# Patient Record
Sex: Female | Born: 1975 | Race: White | Hispanic: No | Marital: Married | Smoking: Current every day smoker
Health system: Southern US, Community
[De-identification: ages and names within clinical notes are randomized; demographics above are authoritative.]

## PROBLEM LIST (undated history)

## (undated) DIAGNOSIS — I4891 Unspecified atrial fibrillation: Secondary | ICD-10-CM

## (undated) DIAGNOSIS — D497 Neoplasm of unspecified behavior of endocrine glands and other parts of nervous system: Secondary | ICD-10-CM

## (undated) HISTORY — PX: KNEE SURGERY: SHX244

## (undated) HISTORY — PX: OTHER SURGICAL HISTORY: SHX169

## (undated) HISTORY — PX: CHOLECYSTECTOMY: SHX55

## (undated) HISTORY — DX: Neoplasm of unspecified behavior of endocrine glands and other parts of nervous system: D49.7

---

## 1998-05-16 ENCOUNTER — Emergency Department (HOSPITAL_COMMUNITY): Admission: EM | Admit: 1998-05-16 | Discharge: 1998-05-16 | Payer: Self-pay | Admitting: Emergency Medicine

## 1999-05-24 ENCOUNTER — Ambulatory Visit (HOSPITAL_COMMUNITY): Admission: RE | Admit: 1999-05-24 | Discharge: 1999-05-24 | Payer: Self-pay | Admitting: Neurosurgery

## 1999-05-24 ENCOUNTER — Encounter: Payer: Self-pay | Admitting: Neurosurgery

## 2000-11-22 ENCOUNTER — Ambulatory Visit (HOSPITAL_COMMUNITY): Admission: RE | Admit: 2000-11-22 | Discharge: 2000-11-22 | Payer: Self-pay | Admitting: Neurosurgery

## 2000-11-22 ENCOUNTER — Encounter: Payer: Self-pay | Admitting: Neurosurgery

## 2005-03-27 ENCOUNTER — Ambulatory Visit: Payer: Self-pay | Admitting: Internal Medicine

## 2005-04-01 ENCOUNTER — Ambulatory Visit: Payer: Self-pay | Admitting: Internal Medicine

## 2005-04-08 ENCOUNTER — Ambulatory Visit: Payer: Self-pay | Admitting: Internal Medicine

## 2005-04-08 ENCOUNTER — Ambulatory Visit (HOSPITAL_COMMUNITY): Admission: RE | Admit: 2005-04-08 | Discharge: 2005-04-08 | Payer: Self-pay | Admitting: Internal Medicine

## 2005-04-10 ENCOUNTER — Ambulatory Visit (HOSPITAL_COMMUNITY): Admission: RE | Admit: 2005-04-10 | Discharge: 2005-04-10 | Payer: Self-pay | Admitting: Internal Medicine

## 2005-04-10 ENCOUNTER — Ambulatory Visit: Payer: Self-pay | Admitting: Internal Medicine

## 2005-05-01 ENCOUNTER — Ambulatory Visit: Payer: Self-pay | Admitting: Internal Medicine

## 2005-05-07 ENCOUNTER — Ambulatory Visit (HOSPITAL_COMMUNITY): Admission: RE | Admit: 2005-05-07 | Discharge: 2005-05-07 | Payer: Self-pay | Admitting: Internal Medicine

## 2005-05-13 ENCOUNTER — Ambulatory Visit: Payer: Self-pay | Admitting: Internal Medicine

## 2006-11-03 IMAGING — CT CT ABDOMEN W/ CM
1 of 3 series · 14 of 32 positions shown, 19 images · IV contrast (CONTRAST)
Comparison: none

CLINICAL DATA: Right upper abdominal pain.

[Series 4229: — · axial · 0.81mm/px · z∈[+1438,+1948]mm · 14 of 116 slices shown, 19 images]
[im 7/116  soft-tissue]
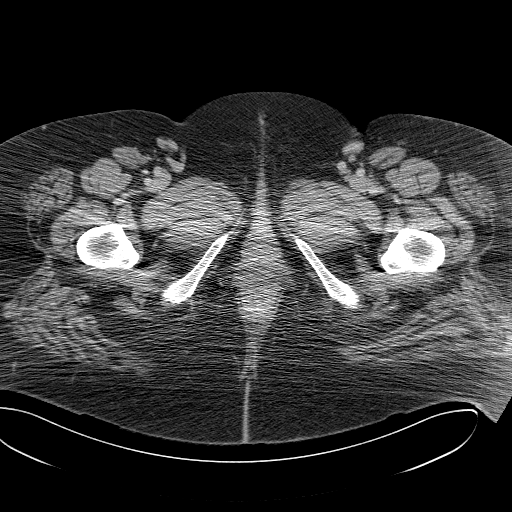
[im 7/116  bone]
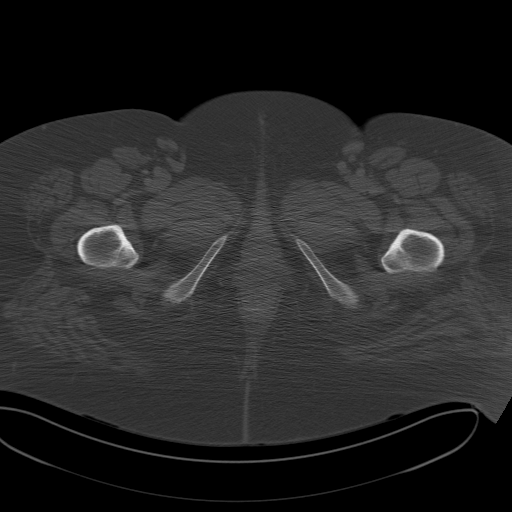
[im 13/116  soft-tissue]
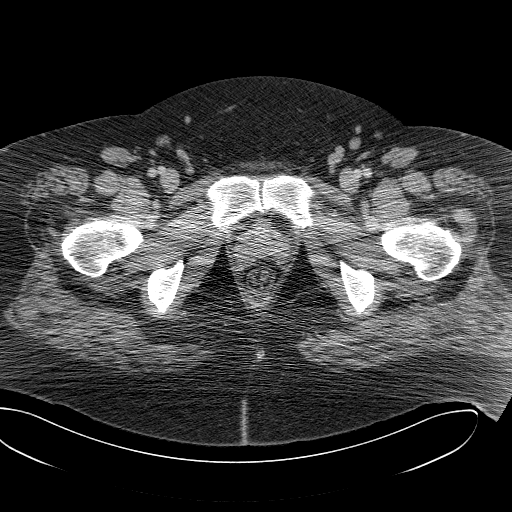
[im 26/116  soft-tissue]
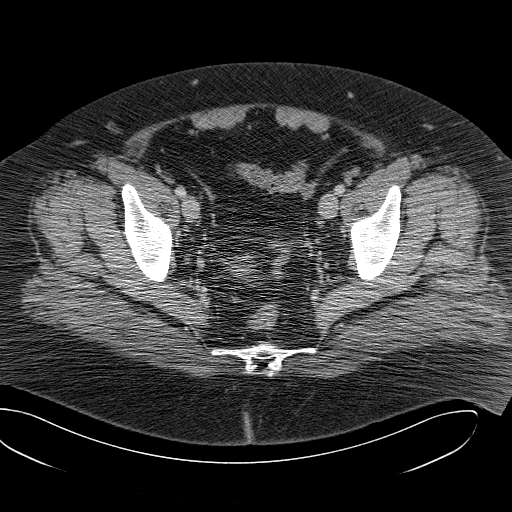
[im 32/116  soft-tissue]
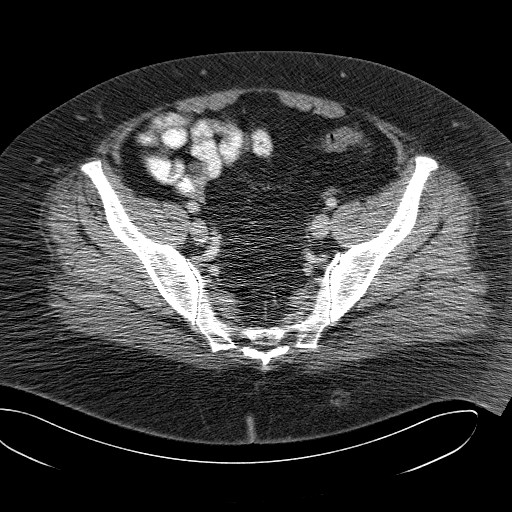
[im 39/116  soft-tissue]
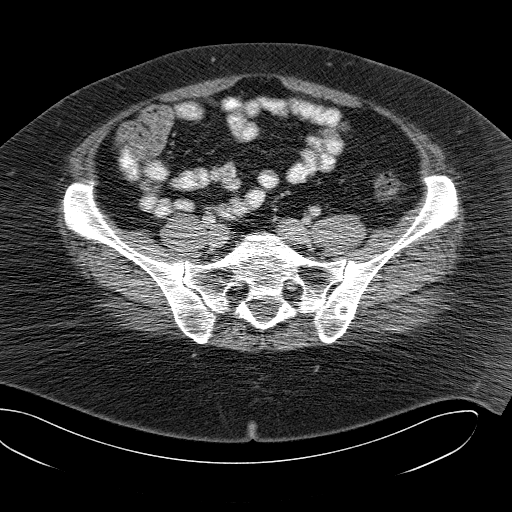
[im 52/116  soft-tissue]
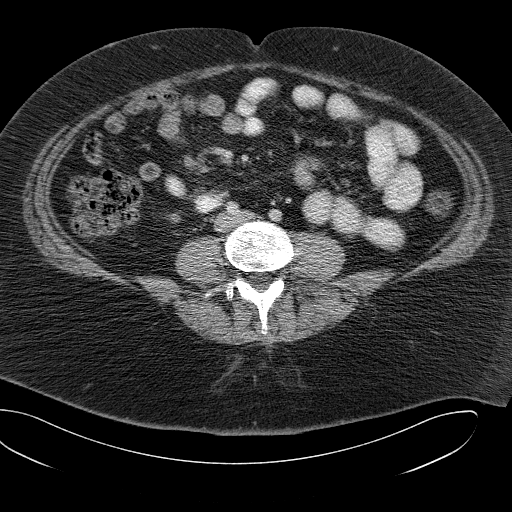
[im 58/116  soft-tissue]
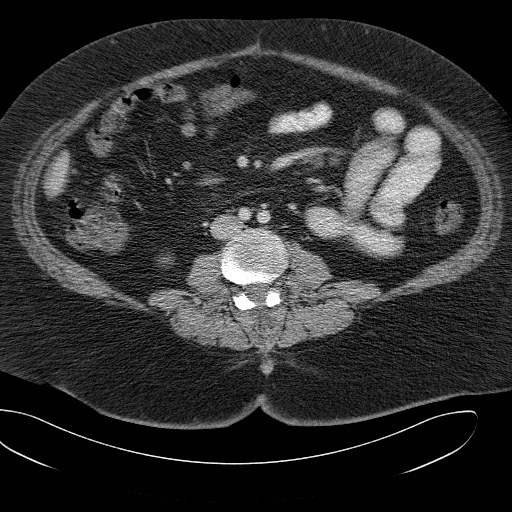
[im 64/116  soft-tissue]
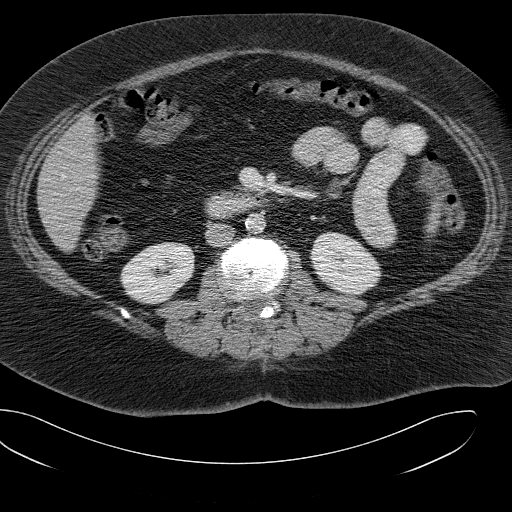
[im 77/116  soft-tissue]
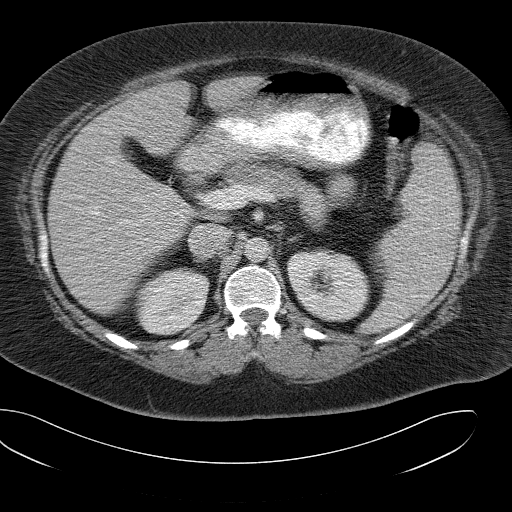
[im 77/116  bone]
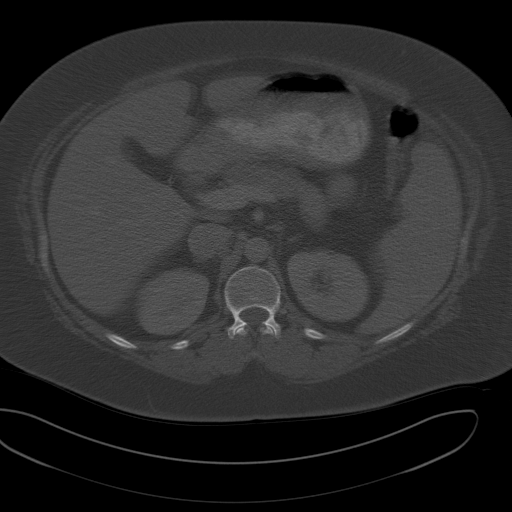
[im 84/116  soft-tissue]
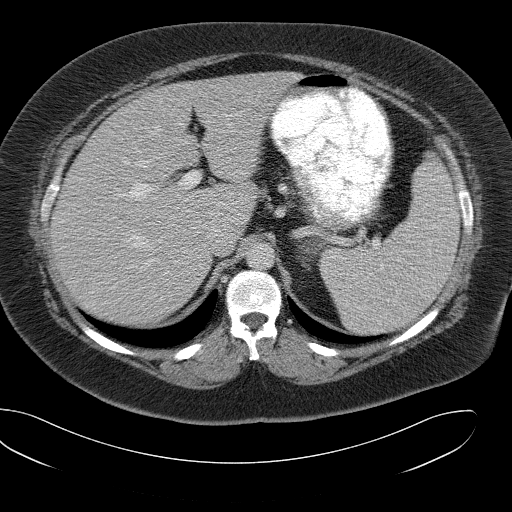
[im 90/116  soft-tissue]
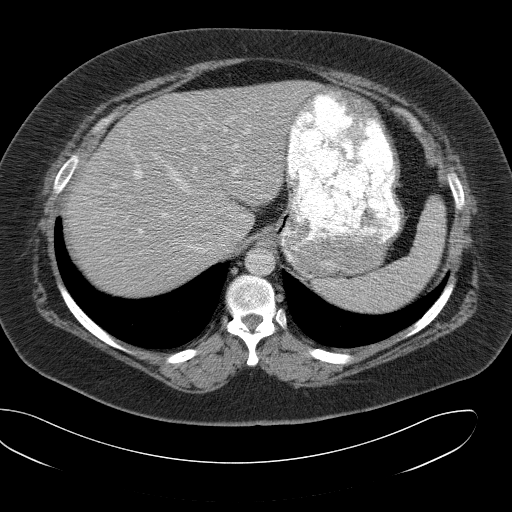
[im 90/116  lung]
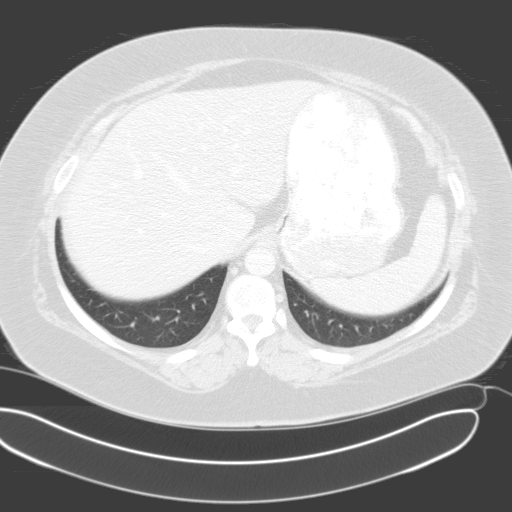
[im 96/116  lung]
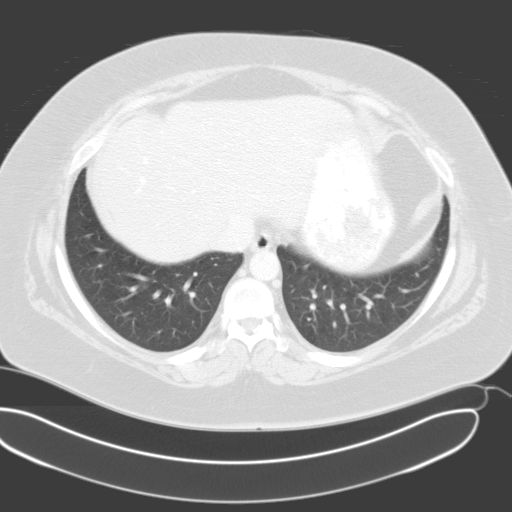
[im 103/116  soft-tissue]
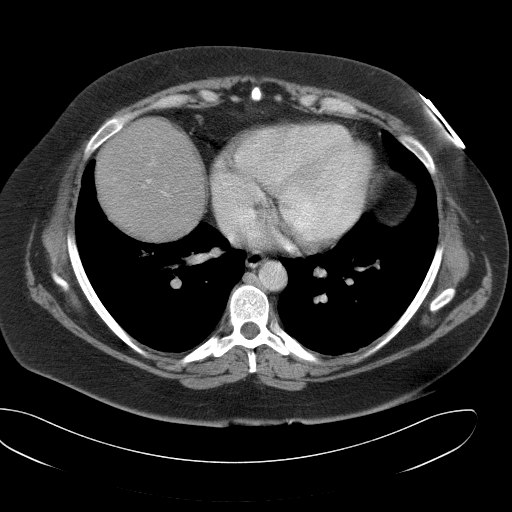
[im 103/116  lung]
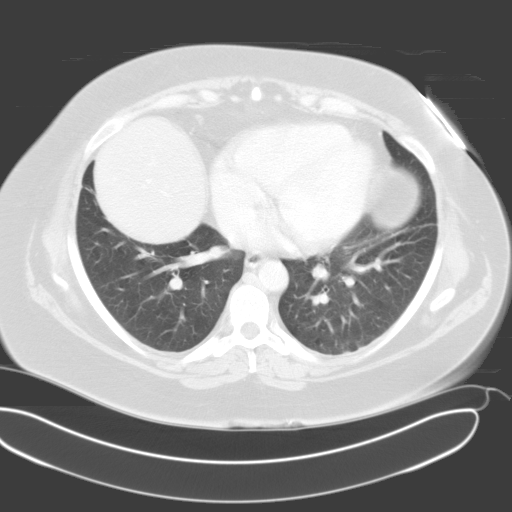
[im 109/116  soft-tissue]
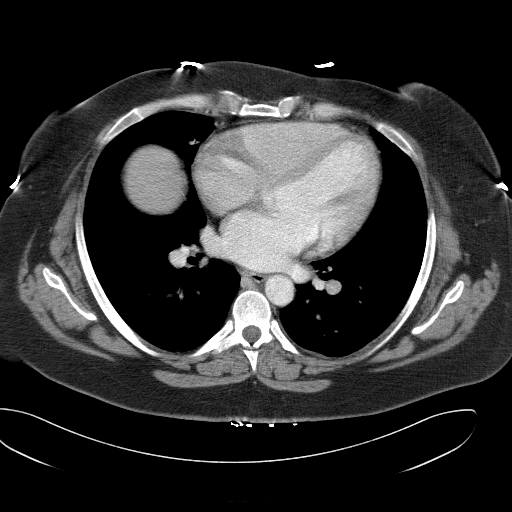
[im 109/116  lung]
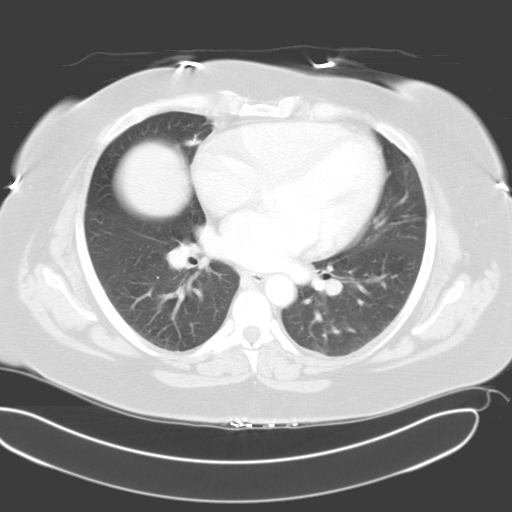

[14 of 32 positions shown; findings below may reference images not displayed]

CT abdomen with contrast:

Multidetector helical CT after 150 ml Zmnipaque-144 IV.  
No previous for comparison. Visualized lung bases clear. Vascular clips in the
gallbladder fossa. Unremarkable liver, gallbladder, adrenal glands, kidneys,
pancreas. Mild atheromatous change in the infrarenal abdominal aorta without
aneurysm. No free air. No ascites. Small bowel decompressed. Postoperative   and
degenerative changes in the lower lumbar spine. Scattered enlarged mesenteric
lymph nodes are noted in the right abdomen, left midabdomen, and at the root of
the mesentery, although no node measures greater than 1 cm in short axis
diameter.
IMPRESSION: 1. Borderline mesenteric adenopathy, nonspecific.
2. Postoperative changes as above.

CT pelvis with contrast:

Normal appendix. Colon is nondilated, unremarkable. Urinary bladder incompletely
distended. Uterus and adnexal regions unremarkable. No free fluid. No
adenopathy.
IMPRESSION: 1. Normal appendix.

## 2020-07-31 ENCOUNTER — Ambulatory Visit (INDEPENDENT_AMBULATORY_CARE_PROVIDER_SITE_OTHER): Payer: BC Managed Care – PPO

## 2020-07-31 ENCOUNTER — Other Ambulatory Visit: Payer: Self-pay

## 2020-07-31 ENCOUNTER — Ambulatory Visit (INDEPENDENT_AMBULATORY_CARE_PROVIDER_SITE_OTHER): Payer: BC Managed Care – PPO | Admitting: Podiatry

## 2020-07-31 DIAGNOSIS — M2041 Other hammer toe(s) (acquired), right foot: Secondary | ICD-10-CM

## 2020-07-31 DIAGNOSIS — R52 Pain, unspecified: Secondary | ICD-10-CM

## 2020-07-31 DIAGNOSIS — M779 Enthesopathy, unspecified: Secondary | ICD-10-CM | POA: Diagnosis not present

## 2020-08-02 NOTE — Progress Notes (Signed)
Subjective:   Patient ID: Brandi Meyers, female   DOB: 44 y.o.   MRN: [id number]   HPI Patient presents stating she has been having a lot of pain in her ankle and then she is having a lot of problems with the fourth toe on her right foot where there is a lesion that is been very painful and making it hard for her to wear shoe gear.  States she had previous surgery on this toe a number of years ago and this has become very very tender for her.  Patient states she cannot wear shoe gear with any degree of comfort and does not currently smoke and likes to be active   Review of Systems  All other systems reviewed and are negative.       Objective:  Physical Exam Vitals and nursing note reviewed.  Constitutional:      Appearance: She is well-developed.  Pulmonary:     Effort: Pulmonary effort is normal.  Musculoskeletal:        General: Normal range of motion.  Skin:    General: Skin is warm.  Neurological:     Mental Status: She is alert.     Neurovascular status intact muscle strength found to be adequate range of motion within normal limits.  Patient is noted to have a pressure sore on the lateral side of the right fourth digit at the proximal phalanx with a previous what appears to be middle phalangectomy that was done.  It is sore and raw and painful for her and I also noted that there is a lot of discomfort in the sinus tarsi right.  There is no proximal edema erythema drainage noted around the digit it appears to be just irritated painful tissue     Assessment:  Digital deformity fourth right with aggravated tissue secondary to pressure between the fifth and fourth toe and sinus tarsitis with inflammatory capsulitis right     Plan:  H&P reviewed all conditions and patient states her sugar has been under good control.  Today I reviewed her x-rays I then discussed that this area is probably not can heal on its own as she has tried numerous pads cushions without relief and I do think  removal of bone is what can be necessary for this..  I did go ahead today discussed that with her and went over other conservative options and she has opted for correction.  I allowed her to read consent form going over all possible complications and I did explain this is revisional surgery and as a result this is always going to be more difficult and recovery can be different.  Patient wants surgery signed consent form and is scheduled for outpatient surgery as soon as she is able to do it.  I then went ahead did sterile prep and injected the sinus tarsi right 3 mg Kenalog 5 mg Xylocaine and applied sterile dressing.  Reappoint for [place] to recheck  X-rays indicate what appears to be a phalangectomy of the middle phalanx right with a lot of prominence around the proximal phalanx.  No other pathology was noted

## 2020-09-05 ENCOUNTER — Telehealth: Payer: Self-pay

## 2020-09-05 NOTE — Telephone Encounter (Signed)
DOS 09/19/2020  [facility] REPAIR 3,4 RT - 28285  [employer] ST EFFECTIVE DATE - 10/29/2019  PLAN DEDUCTIBLE - $1250.00 W/ $1250.00 REAMINING OUT OF POCKET - $4890.00 W/ $4747.29 REMAINING COPAY $0.00 COINSURANCE - 20% PER SERVICE YEAR  NO AUTH REQUIRED PER WEBSITE

## 2020-09-18 ENCOUNTER — Telehealth: Payer: Self-pay

## 2020-09-18 NOTE — Telephone Encounter (Signed)
Brandi Meyers called needing to cancel her surgery with Dr. Paulla Dolly on 09/19/2020. She stated she went to her PCP and she has an irregular heartbeat. She is being referred to a Cardiologist. She will call us back once she gets the okay from the cardiologist to have surgery. Notified Cynthia at [facility] and Dr. Paulla Dolly.

## 2020-09-24 NOTE — Progress Notes (Signed)
Cardiology Office Note:   Date:  09/26/2020  NAME:  Brandi Meyers    MRN: [id number] DOB:  Sep 28, 1976   PCP:  Rory Percy, MD  Cardiologist:  No primary care provider on file.   Referring MD: Rory Percy, MD   Chief Complaint  Patient presents with  . Palpitations   History of Present Illness:   Brandi Meyers is a 44 y.o. female with a hx of obesity who is being seen today for the evaluation of palpitations at the request of Rory Percy, MD.  He reports he was evaluated in urgent care on 09/18/2020.  Apparently she was shopping.  Had some fluttering in her chest.  The clinic did not have the capability to perform an EKG.  She was informed to follow-up with a cardiologist.  Her blood pressure was a bit elevated at visit 142/80.  She reports she had no chest pain or shortness of breath.  She was seen by her primary care physician who obtained an EKG that was normal.  TSH 1.52.  Her EKG today is normal.  She reports she does exercise routinely has an atrial notations.  She reports no chest pain or shortness of breath.  She can have some tingling in her left arm at times but no major issues.  She does report that she get a fluttering sensation in her chest 2-3 times per month.  Can last seconds to minutes.  No identifiable trigger.  No alleviating factors reported.  She reports no excess caffeine consumption.  No energy drinks or red bowls.  She does smoke.  She smokes a pack a day for nearly 25 years.  She is never had a heart attack or stroke.  Blood pressure is 130/80.  She takes Metformin for PCOS.  She does not have diabetes.  Most recent lab work from her primary care physician demonstrates a total cholesterol 179, HDL 30, LDL 126, triglycerides 116.  A1c 5.7.  TSH 1.52.  She does have a strong family history of heart disease.  She reports heart attacks and strokes in several family members.  Past Medical History: Past Medical History:  Diagnosis Date  . Spinal cord tumor     Past Surgical  History: Past Surgical History:  Procedure Laterality Date  . CHOLECYSTECTOMY    . KNEE SURGERY    . spinal tumor surgery      Current Medications: Current Meds  Medication Sig  . albuterol (VENTOLIN HFA) 108 (90 Base) MCG/ACT inhaler TAKE 2 PUFFS BY MOUTH 4 TIMES A DAY  . b complex vitamins capsule Take by mouth.  . Cholecalciferol 25 MCG (1000 UT) tablet Take by mouth.  . DULoxetine (CYMBALTA) 20 MG capsule Take 20 mg by mouth daily.  Marland Kitchen ibuprofen (ADVIL) 100 MG/5ML suspension Take 200 mg by mouth every 4 (four) hours as needed.  . metFORMIN (GLUCOPHAGE) 1000 MG tablet Take 1,000 mg by mouth 2 (two) times daily with a meal.  . metFORMIN (GLUCOPHAGE-XR) 750 MG 24 hr tablet Take 2 tablets by mouth every morning.  . Multiple Vitamin (MULTIVITAMIN WITH MINERALS) TABS tablet Take 1 tablet by mouth daily.  Marland Kitchen triamcinolone (KENALOG) 0.1 % APPLY TO AFFECTED AREA TWICE A DAY     Allergies:    Patient has no known allergies.   Social History: Social History   Socioeconomic History  . Marital status: Married    Spouse name: Not on file  . Number of children: 0  . Years of education: Not on  file  . Highest education level: Not on file  Occupational History  . Occupation: teahcer's assistannt  Tobacco Use  . Smoking status: Current Every Day Smoker    Packs/day: 1.00    Years: 25.00    Pack years: 25.00  . Smokeless tobacco: Never Used  Substance and Sexual Activity  . Alcohol use: Never  . Drug use: Never  . Sexual activity: Not on file  Other Topics Concern  . Not on file  Social History Narrative  . Not on file   Social Determinants of Health   Financial Resource Strain:   . Difficulty of Paying Living Expenses: Not on file  Food Insecurity:   . Worried About Charity fundraiser in the Last Year: Not on file  . Ran Out of Food in the Last Year: Not on file  Transportation Needs:   . Lack of Transportation (Medical): Not on file  . Lack of Transportation  (Non-Medical): Not on file  Physical Activity:   . Days of Exercise per Week: Not on file  . Minutes of Exercise per Session: Not on file  Stress:   . Feeling of Stress : Not on file  Social Connections:   . Frequency of Communication with Friends and Family: Not on file  . Frequency of Social Gatherings with Friends and Family: Not on file  . Attends Religious Services: Not on file  . Active Member of Clubs or Organizations: Not on file  . Attends Archivist Meetings: Not on file  . Marital Status: Not on file     Family History: The patient's family history includes Arrhythmia in her mother; Heart attack in her maternal grandmother; Heart failure in her mother; Stroke in her father.  ROS:   All other ROS reviewed and negative. Pertinent positives noted in the HPI.     EKGs/Labs/Other Studies Reviewed:   The following studies were personally reviewed by me today:  EKG:  EKG is ordered today.  The ekg ordered today demonstrates normal sinus rhythm, heart rate 68, no acute ischemic changes or evidence of infarction, and was personally reviewed by me.   Recent Labs: No results found for requested labs within last 8760 hours.   Recent Lipid Panel No results found for: CHOL, TRIG, HDL, CHOLHDL, VLDL, LDLCALC, LDLDIRECT  Physical Exam:   VS:  BP 130/82   Pulse 68   Ht 5' 10.5" (1.791 m)   Wt 292 lb (132.5 kg)   PF 99 L/min   BMI 41.31 kg/m    Wt Readings from Last 3 Encounters:  09/26/20 292 lb (132.5 kg)    General: Well nourished, well developed, in no acute distress Heart: Atraumatic, normal size  Eyes: PEERLA, EOMI  Neck: Supple, no JVD Endocrine: No thryomegaly Cardiac: Normal S1, S2; RRR; no murmurs, rubs, or gallops Lungs: Clear to auscultation bilaterally, no wheezing, rhonchi or rales  Abd: Soft, nontender, no hepatomegaly  Ext: No edema, pulses 2+ Musculoskeletal: No deformities, BUE and BLE strength normal and equal Skin: Warm and dry, no rashes     Neuro: Alert and oriented to person, place, time, and situation, CNII-XII grossly intact, no focal deficits  Psych: Normal mood and affect   ASSESSMENT:   Brandi Meyers is a 44 y.o. female who presents for the following: 1. Palpitations   2. Obesity, morbid, BMI 40.0-49.9 ([place])   3. Tobacco abuse     PLAN:   1. Palpitations 2. Obesity, morbid, BMI 40.0-49.9 ([place]) 3. Tobacco abuse -  Episodes of palpitations every month.  Occur 2-3 times per month.  Described as extra heartbeats.  EKG today is normal.  No evidence of cardiovascular disease on my examination.  Could be an arrhythmia versus stress or anxiety.  I recommended a 2-week Zio patch for further evaluation.  Recent TSH was normal.  She is not anemic.  No excess caffeine reported.  I will see her back in 3 months after the monitor.  Disposition: Return in about 3 months (around 12/25/2020).  Medication Adjustments/Labs and Tests Ordered: Current medicines are reviewed at length with the patient today.  Concerns regarding medicines are outlined above.  Orders Placed This Encounter  Procedures  . LONG TERM MONITOR (3-14 DAYS)  . EKG 12-Lead   No orders of the defined types were placed in this encounter.   Patient Instructions  Medication Instructions:  Continue same medications   Lab Work: None ordered   Testing/Procedures: 2 week Zio Monitor   Follow-Up: At [facility], you and your health needs are our priority.  As part of our continuing mission to provide you with exceptional heart care, we have created designated Provider Care Teams.  These Care Teams include your primary Cardiologist (physician) and Advanced Practice Providers (APPs -  Physician Assistants and Nurse Practitioners) who all work together to provide you with the care you need, when you need it.  We recommend signing up for the patient portal called "MyChart".  Sign up information is provided on this After Visit Summary.  MyChart is used to connect  with patients for Virtual Visits (Telemedicine).  Patients are able to view lab/test results, encounter notes, upcoming appointments, etc.  Non-urgent messages can be sent to your provider as well.   To learn more about what you can do with MyChart, go to [email or web address].    Your next appointment:  3 months   The format for your next appointment:  Office     Provider:  Dr.O'Neal      Signed, Addison Naegeli. Audie Box, [place]  [address], [place],  [address] [phone]  09/26/2020 12:01 PM

## 2020-09-25 ENCOUNTER — Encounter: Payer: BC Managed Care – PPO | Admitting: Podiatry

## 2020-09-26 ENCOUNTER — Encounter: Payer: Self-pay | Admitting: Cardiovascular Disease

## 2020-09-26 ENCOUNTER — Encounter: Payer: Self-pay | Admitting: *Deleted

## 2020-09-26 ENCOUNTER — Other Ambulatory Visit: Payer: Self-pay

## 2020-09-26 ENCOUNTER — Ambulatory Visit: Payer: BC Managed Care – PPO | Admitting: Cardiovascular Disease

## 2020-09-26 VITALS — BP 130/82 | HR 68 | Ht 70.5 in | Wt 292.0 lb

## 2020-09-26 DIAGNOSIS — R002 Palpitations: Secondary | ICD-10-CM | POA: Diagnosis not present

## 2020-09-26 DIAGNOSIS — Z720 Tobacco use: Secondary | ICD-10-CM | POA: Diagnosis not present

## 2020-09-26 NOTE — Patient Instructions (Signed)
Medication Instructions:  Continue same medications   Lab Work: None ordered   Testing/Procedures: 2 week Zio Monitor   Follow-Up: At [facility], you and your health needs are our priority.  As part of our continuing mission to provide you with exceptional heart care, we have created designated Provider Care Teams.  These Care Teams include your primary Cardiologist (physician) and Advanced Practice Providers (APPs -  Physician Assistants and Nurse Practitioners) who all work together to provide you with the care you need, when you need it.  We recommend signing up for the patient portal called "MyChart".  Sign up information is provided on this After Visit Summary.  MyChart is used to connect with patients for Virtual Visits (Telemedicine).  Patients are able to view lab/test results, encounter notes, upcoming appointments, etc.  Non-urgent messages can be sent to your provider as well.   To learn more about what you can do with MyChart, go to [email or web address].    Your next appointment:  3 months   The format for your next appointment:  Office     Provider:  Dr.O'Neal

## 2020-09-26 NOTE — Progress Notes (Signed)
Patient ID: Brandi Meyers, female   DOB: 12/28/1975, 44 y.o.   MRN: [id number] Patient enrolled for [employer] to ship a 14 day ZIO XT long term holter monitor via [employer] to  [address] [place], [place]  [address].  Letter with instructions mailed to patient as same address.

## 2020-10-01 ENCOUNTER — Ambulatory Visit (INDEPENDENT_AMBULATORY_CARE_PROVIDER_SITE_OTHER): Payer: BC Managed Care – PPO

## 2020-10-01 DIAGNOSIS — R002 Palpitations: Secondary | ICD-10-CM | POA: Diagnosis not present

## 2020-10-01 DIAGNOSIS — Z720 Tobacco use: Secondary | ICD-10-CM

## 2020-12-31 NOTE — Progress Notes (Unsigned)
Cardiology Office Note:   Date:  01/01/2021  NAME:  Brandi Meyers    MRN: [id number] DOB:  1976/04/01   PCP:  Rory Percy, MD  Cardiologist:  No primary care provider on file.   Referring MD: Rory Percy, MD   Chief Complaint  Patient presents with  . svt   History of Present Illness:   Brandi Meyers is a 45 y.o. female with a hx of SVT who presents for follow-up.  Evaluated for palpitations.  Recent monitor shows brief SVT episodes which are likely EAT versus AVNRT. She reports she is still having episodes of palpitations.  They occur 4-5 times per week.  They can last minutes.  She reports some days they can happen more frequently.  She did buy the cardia mobile device.  She reports she is only captured sinus rhythm and some indeterminate rhythms.  On my review she is having PACs.  We discussed starting metoprolol succinate 25 mg a day.  She reports she is okay to do this.  She is walking 1 to 3 miles per day.  No significant shortness of breath.  She reports occasional chest tightness at rest.  This is infrequent.  We discussed sleep apnea may be a possibility.  She does snore.  BMI is 41.  She is okay to proceed with a home sleep study.  She is to take daily.  No excess caffeine consumption reported.  She is still smoking.  She was advised to cut back.  Her blood pressure is 136/94.  We discussed calcium scoring.  She is interested.  Her most recent LDL cholesterol is 126.  10-year ASCVD risk or 6.7 which is borderline based on most recent numbers.  Problem List 1. SVT -EAT vs AVNRT 2. HLD -Total cholesterol 179, HDL 30, LDL 126, triglycerides 116 3. Tobacco abuse  4. Obesity  -BMI 41  Past Medical History: Past Medical History:  Diagnosis Date  . Spinal cord tumor     Past Surgical History: Past Surgical History:  Procedure Laterality Date  . CHOLECYSTECTOMY    . KNEE SURGERY    . spinal tumor surgery      Current Medications: Current Meds  Medication Sig  . albuterol  (VENTOLIN HFA) 108 (90 Base) MCG/ACT inhaler TAKE 2 PUFFS BY MOUTH 4 TIMES A DAY  . b complex vitamins capsule Take by mouth.  . Cholecalciferol 25 MCG (1000 UT) tablet Take by mouth.  . DULoxetine (CYMBALTA) 20 MG capsule Take 20 mg by mouth daily.  Marland Kitchen ibuprofen (ADVIL) 100 MG/5ML suspension Take 200 mg by mouth every 4 (four) hours as needed.  . metFORMIN (GLUCOPHAGE) 1000 MG tablet Take 1,000 mg by mouth 2 (two) times daily with a meal.  . metFORMIN (GLUCOPHAGE-XR) 750 MG 24 hr tablet Take 2 tablets by mouth every morning.  . metoprolol succinate (TOPROL XL) 25 MG 24 hr tablet Take 1 tablet (25 mg total) by mouth daily.  . Multiple Vitamin (MULTIVITAMIN WITH MINERALS) TABS tablet Take 1 tablet by mouth daily.  Marland Kitchen triamcinolone (KENALOG) 0.1 % APPLY TO AFFECTED AREA TWICE A DAY  . Zinc 50 MG TABS Take by mouth.     Allergies:    Patient has no known allergies.   Social History: Social History   Socioeconomic History  . Marital status: Married    Spouse name: Not on file  . Number of children: 0  . Years of education: Not on file  . Highest education level: Not on file  Occupational History  . Occupation: teahcer's assistannt  Tobacco Use  . Smoking status: Current Every Day Smoker    Packs/day: 1.00    Years: 25.00    Pack years: 25.00  . Smokeless tobacco: Never Used  Substance and Sexual Activity  . Alcohol use: Never  . Drug use: Never  . Sexual activity: Not on file  Other Topics Concern  . Not on file  Social History Narrative  . Not on file   Social Determinants of Health   Financial Resource Strain: Not on file  Food Insecurity: Not on file  Transportation Needs: Not on file  Physical Activity: Not on file  Stress: Not on file  Social Connections: Not on file     Family History: The patient's family history includes Arrhythmia in her mother; Heart attack in her maternal grandmother; Heart failure in her mother; Stroke in her father.  ROS:   All other ROS  reviewed and negative. Pertinent positives noted in the HPI.     EKGs/Labs/Other Studies Reviewed:   The following studies were personally reviewed by me today:  Zio 10/01/2020 1. Brief SVT episodes (19 in 2 weeks; longest 5.2 seconds) that appear to be ectopic atrial tachycardia vs AVNRT.  2. Symptoms occurred with normal sinus rhythm.  3. Rare ectopy.    Recent Labs: No results found for requested labs within last 8760 hours.   Recent Lipid Panel No results found for: CHOL, TRIG, HDL, CHOLHDL, VLDL, LDLCALC, LDLDIRECT  Physical Exam:   VS:  BP (!) 136/94   Pulse 76   Ht 5\' 10"  (1.778 m)   Wt 288 lb (130.6 kg)   SpO2 96%   BMI 41.32 kg/m    Wt Readings from Last 3 Encounters:  01/01/21 288 lb (130.6 kg)  09/26/20 292 lb (132.5 kg)    General: Well nourished, well developed, in no acute distress Head: Atraumatic, normal size  Eyes: PEERLA, EOMI  Neck: Supple, no JVD Endocrine: No thryomegaly Cardiac: Normal S1, S2; RRR; no murmurs, rubs, or gallops Lungs: Clear to auscultation bilaterally, no wheezing, rhonchi or rales  Abd: Soft, nontender, no hepatomegaly  Ext: No edema, pulses 2+ Musculoskeletal: No deformities, BUE and BLE strength normal and equal Skin: Warm and dry, no rashes   Neuro: Alert and oriented to person, place, time, and situation, CNII-XII grossly intact, no focal deficits  Psych: Normal mood and affect   ASSESSMENT:   Brandi Meyers is a 45 y.o. female who presents for the following: 1. SVT (supraventricular tachycardia) ([place])   2. Snoring   3. Tobacco abuse   4. Obesity, morbid, BMI 40.0-49.9 ([place])   5. Mixed hyperlipidemia     PLAN:   1. SVT (supraventricular tachycardia) ([facility]) -Brief atrial tachycardia episodes on monitor.  Still having symptoms.  Start metoprolol succinate 25 mg daily.  She needs a sleep study.  See below.  I have also recommended an echocardiogram.  EKG really unremarkable and cardiovascular exam normal.  2. Snoring -She  does screen positive for sleep apnea.  She snores frequently and is fatigued.  She is also having arrhythmias.  Her BMI is 41.  She needs a sleep study.  3. Tobacco abuse -Smoking cessation advised.  4. Obesity, morbid, BMI 40.0-49.9 ([place]) -Diet and exercise recommended.  5. Mixed hyperlipidemia -Most recent LDL cholesterol is 126.  10-year ASCVD risk score is 6.7% which is borderline.  Given her family history she will pursue calcium scoring.  Disposition: Return in about 6 months (  around 07/04/2021).  Medication Adjustments/Labs and Tests Ordered: Current medicines are reviewed at length with the patient today.  Concerns regarding medicines are outlined above.  Orders Placed This Encounter  Procedures  . CT CARDIAC SCORING (SELF PAY ONLY)  . ECHOCARDIOGRAM COMPLETE  . Home sleep test   Meds ordered this encounter  Medications  . metoprolol succinate (TOPROL XL) 25 MG 24 hr tablet    Sig: Take 1 tablet (25 mg total) by mouth daily.    Dispense:  90 tablet    Refill:  1    Patient Instructions  Medication Instructions:  Start Metoprolol 25 mg daily   *If you need a refill on your cardiac medications before your next appointment, please call your pharmacy*   Testing/Procedures: Echocardiogram - Your physician has requested that you have an echocardiogram. Echocardiography is a painless test that uses sound waves to create images of your heart. It provides your doctor with information about the size and shape of your heart and how well your heart's chambers and valves are working. This procedure takes approximately one hour. There are no restrictions for this procedure. This will be performed at our [facility] location - [address], [address].  Your physician has recommended that you have a sleep study. This test records several body functions during sleep, including: brain activity, eye movement, oxygen and carbon dioxide blood levels, heart rate and rhythm, breathing rate  and rhythm, the flow of air through your mouth and nose, snoring, body muscle movements, and chest and belly movement.  CALCIUM SCORE  Follow-Up: At [facility], you and your health needs are our priority.  As part of our continuing mission to provide you with exceptional heart care, we have created designated Provider Care Teams.  These Care Teams include your primary Cardiologist (physician) and Advanced Practice Providers (APPs -  Physician Assistants and Nurse Practitioners) who all work together to provide you with the care you need, when you need it.  We recommend signing up for the patient portal called "MyChart".  Sign up information is provided on this After Visit Summary.  MyChart is used to connect with patients for Virtual Visits (Telemedicine).  Patients are able to view lab/test results, encounter notes, upcoming appointments, etc.  Non-urgent messages can be sent to your provider as well.   To learn more about what you can do with MyChart, go to [email or web address].    Your next appointment:   6 month(s)  The format for your next appointment:   In Person  Provider:   Eleonore Chiquito, MD      Time Spent with Patient: I have spent a total of 25 minutes with patient reviewing hospital notes, telemetry, EKGs, labs and examining the patient as well as establishing an assessment and plan that was discussed with the patient.  > 50% of time was spent in direct patient care.  Signed, Addison Naegeli. Audie Box, [place]  [address] [place], [place] [address] [phone]  01/01/2021 11:54 AM

## 2021-01-01 ENCOUNTER — Other Ambulatory Visit: Payer: Self-pay

## 2021-01-01 ENCOUNTER — Encounter: Payer: Self-pay | Admitting: Cardiovascular Disease

## 2021-01-01 ENCOUNTER — Ambulatory Visit (INDEPENDENT_AMBULATORY_CARE_PROVIDER_SITE_OTHER): Payer: BC Managed Care – PPO | Admitting: Cardiovascular Disease

## 2021-01-01 VITALS — BP 136/94 | HR 76 | Ht 70.0 in | Wt 288.0 lb

## 2021-01-01 DIAGNOSIS — Z720 Tobacco use: Secondary | ICD-10-CM | POA: Diagnosis not present

## 2021-01-01 DIAGNOSIS — I471 Supraventricular tachycardia, unspecified: Secondary | ICD-10-CM

## 2021-01-01 DIAGNOSIS — R0683 Snoring: Secondary | ICD-10-CM

## 2021-01-01 DIAGNOSIS — E782 Mixed hyperlipidemia: Secondary | ICD-10-CM

## 2021-01-01 MED ORDER — METOPROLOL SUCCINATE ER 25 MG PO TB24: 25.0000 mg | ORAL_TABLET | Freq: Every day | ORAL | 1 refills | Status: DC

## 2021-01-01 NOTE — Patient Instructions (Signed)
Medication Instructions:  Start Metoprolol 25 mg daily   *If you need a refill on your cardiac medications before your next appointment, please call your pharmacy*   Testing/Procedures: Echocardiogram - Your physician has requested that you have an echocardiogram. Echocardiography is a painless test that uses sound waves to create images of your heart. It provides your doctor with information about the size and shape of your heart and how well your hearts chambers and valves are working. This procedure takes approximately one hour. There are no restrictions for this procedure. This will be performed at our [facility] location - [address].  Your physician has recommended that you have a sleep study. This test records several body functions during sleep, including: brain activity, eye movement, oxygen and carbon dioxide blood levels, heart rate and rhythm, breathing rate and rhythm, the flow of air through your mouth and nose, snoring, body muscle movements, and chest and belly movement.  CALCIUM SCORE  Follow-Up: At [facility], you and your health needs are our priority.  As part of our continuing mission to provide you with exceptional heart care, we have created designated Provider Care Teams.  These Care Teams include your primary Cardiologist (physician) and Advanced Practice Providers (APPs -  Physician Assistants and Nurse Practitioners) who all work together to provide you with the care you need, when you need it.  We recommend signing up for the patient portal called "MyChart".  Sign up information is provided on this After Visit Summary.  MyChart is used to connect with patients for Virtual Visits (Telemedicine).  Patients are able to view lab/test results, encounter notes, upcoming appointments, etc.  Non-urgent messages can be sent to your provider as well.   To learn more about what you can do with MyChart, go to [email or web address].    Your next appointment:    6 month(s)  The format for your next appointment:   In Person  Provider:   Eleonore Chiquito, MD

## 2021-01-10 ENCOUNTER — Telehealth: Payer: Self-pay | Admitting: *Deleted

## 2021-01-10 NOTE — Telephone Encounter (Signed)
Called patient to inform of sleep study appointment. Phone went to voice mail. Could not leave a message. Mailbox is full. Will attempt again.

## 2021-01-12 NOTE — Telephone Encounter (Signed)
Left HST appointment details on cell Voicemail.

## 2021-01-25 ENCOUNTER — Ambulatory Visit (INDEPENDENT_AMBULATORY_CARE_PROVIDER_SITE_OTHER)
Admission: RE | Admit: 2021-01-25 | Discharge: 2021-01-25 | Disposition: A | Discharge status: Home / Self Care | Payer: Self-pay | Source: Ambulatory Visit | Attending: Cardiovascular Disease | Admitting: Cardiovascular Disease

## 2021-01-25 ENCOUNTER — Other Ambulatory Visit: Payer: Self-pay

## 2021-01-25 ENCOUNTER — Ambulatory Visit (HOSPITAL_COMMUNITY): Payer: BC Managed Care – PPO | Attending: Cardiovascular Disease

## 2021-01-25 DIAGNOSIS — I471 Supraventricular tachycardia: Secondary | ICD-10-CM

## 2021-01-25 LAB — ECHOCARDIOGRAM COMPLETE
Area-P 1/2: 3.31 cm2
S' Lateral: 3.8 cm

## 2021-01-30 ENCOUNTER — Telehealth: Payer: Self-pay | Admitting: Cardiovascular Disease

## 2021-01-30 MED ORDER — ROSUVASTATIN CALCIUM 20 MG PO TABS: 20.0000 mg | ORAL_TABLET | Freq: Every day | ORAL | 3 refills | Status: DC

## 2021-01-30 NOTE — Telephone Encounter (Signed)
Pt updated with calcium score and MD's recommendations and verbalized understanding. Orders placed for Crestor 20 mg daily.

## 2021-01-30 NOTE — Telephone Encounter (Signed)
Patient was returning phone call 

## 2021-01-31 ENCOUNTER — Encounter: Payer: BC Managed Care – PPO | Admitting: Cardiovascular Disease

## 2021-02-06 ENCOUNTER — Other Ambulatory Visit: Payer: Self-pay

## 2021-02-06 ENCOUNTER — Ambulatory Visit: Payer: BC Managed Care – PPO | Attending: Cardiovascular Disease | Admitting: Cardiovascular Disease

## 2021-02-06 DIAGNOSIS — R0683 Snoring: Secondary | ICD-10-CM

## 2021-02-06 DIAGNOSIS — G4733 Obstructive sleep apnea (adult) (pediatric): Secondary | ICD-10-CM

## 2021-02-24 ENCOUNTER — Encounter: Payer: Self-pay | Admitting: Cardiovascular Disease

## 2021-02-24 NOTE — Procedures (Signed)
                             [facility]         Patient Name: Brandi, Meyers Date: 02/06/2021 Gender: Female D.O.B: [birth year] Age (years): 45 Referring Provider: Cassie Freer ONeal Height (inches): 42 Interpreting Physician: Shelva Majestic MD, ABSM Weight (lbs): 288 RPSGT: Rosebud Poles BMI: 41 MRN: [id number] Neck Size: <br>  CLINICAL INFORMATION Sleep Study Type: HST  Indication for sleep study: snoring, fatigue, palpitations, obesity  Epworth Sleepiness Score: 7  SLEEP STUDY TECHNIQUE A multi-channel overnight portable sleep study was performed. The channels recorded were: nasal airflow, thoracic respiratory movement, and oxygen saturation with a pulse oximetry. Snoring was also monitored.  MEDICATIONS albuterol (VENTOLIN HFA) 108 (90 Base) MCG/ACT inhaler b complex vitamins capsule Cholecalciferol 25 MCG (1000 UT) tablet DULoxetine (CYMBALTA) 20 MG capsule ibuprofen (ADVIL) 100 MG/5ML suspension metFORMIN (GLUCOPHAGE) 1000 MG tablet metFORMIN (GLUCOPHAGE-XR) 750 MG 24 hr tablet metoprolol succinate (TOPROL XL) 25 MG 24 hr tablet Multiple Vitamin (MULTIVITAMIN WITH MINERALS) TABS tablet rosuvastatin (CRESTOR) 20 MG tablet triamcinolone (KENALOG) 0.1 % Zinc 50 MG TABS Patient self administered medications include: N/A.  SLEEP ARCHITECTURE Patient was studied for 173 minutes. The sleep efficiency was 97.5 % and the patient was supine for 71.2%. The arousal index was 0.0 per hour.  RESPIRATORY PARAMETERS The overall AHI was 46.1 per hour, with a central apnea index of 0 per hour. There is a severe positional component with supine sleep AHI 64.8/h.  The oxygen nadir was 84% during sleep.  CARDIAC DATA Mean heart rate during sleep was 68.8 bpm.  IMPRESSIONS - Severe obstructive sleep apnea occurred during this study (AHI 46.1/h). - Moderate oxygen desaturation to a nadir of 84%. - Patient snored 25.2% during the sleep.  DIAGNOSIS - Obstructive Sleep  Apnea (G47.33)  RECOMMENDATIONS - Recommend a CPAP titratration study to assess optimal therapy for her severe sleep disordered breathing. - Effort should be made to optimize nasal and oropharyngeal patency. - Positional therapy avoiding supine position during sleep. - Avoid alcohol, sedatives and other CNS depressants that may worsen sleep apnea and disrupt normal sleep architecture. - Sleep hygiene should be reviewed to assess factors that may improve sleep quality. - Weight management (BMI 41) and regular exercise should be initiated or continued. - Recommend a download and sleep clinic evaluation after initiation of CPAP therapy.  [Electronically signed] 02/24/2021 01:53 PM  Shelva Majestic MD, [facility], [place], American Board of Sleep Medicine   NPI: [id number] [place] PH: [phone]   FX: [phone] [place]

## 2021-03-08 ENCOUNTER — Other Ambulatory Visit: Payer: Self-pay | Admitting: Cardiovascular Disease

## 2021-03-08 ENCOUNTER — Telehealth: Payer: Self-pay | Admitting: *Deleted

## 2021-03-08 DIAGNOSIS — G4733 Obstructive sleep apnea (adult) (pediatric): Secondary | ICD-10-CM

## 2021-03-08 NOTE — Telephone Encounter (Signed)
Left message to return a call to discuss sleep study results. 

## 2021-03-08 NOTE — Telephone Encounter (Signed)
-----   Message from Thomas A Kelly, MD sent at 02/24/2021  1:58 PM EDT ----- Brandi Meyers, please notify pt of results and try to schedule for in-lab titration; if unable then Auto-PAP 6-18 cm 

## 2021-03-09 NOTE — Telephone Encounter (Signed)
-----   Message from Troy Sine, MD sent at 02/24/2021  1:58 PM EDT ----- Mariann Laster, please notify pt of results and try to schedule for in-lab titration; if unable then Auto-PAP 6-18 cm

## 2021-03-09 NOTE — Telephone Encounter (Signed)
Patient returned a call to me and was given HST results and recommendations. She agrees to proceed with having CPAP titration study.

## 2021-03-12 ENCOUNTER — Telehealth: Payer: Self-pay | Admitting: *Deleted

## 2021-03-12 NOTE — Telephone Encounter (Signed)
-----   Message from Lauralee Evener, [place] sent at 03/09/2021  4:11 PM EDT ----- titration

## 2021-03-12 NOTE — Telephone Encounter (Signed)
Left CPAP titration appointment on patient's voice mail. 

## 2021-07-01 ENCOUNTER — Other Ambulatory Visit: Payer: Self-pay | Admitting: Cardiovascular Disease

## 2021-07-02 NOTE — Progress Notes (Deleted)
Cardiology Office Note:   Date:  07/02/2021  NAME:  Brandi Meyers    MRN: [id number] DOB:  03-10-1976   PCP:  Rory Percy, MD  Cardiologist:  None  Electrophysiologist:  None   Referring MD: Rory Percy, MD   No chief complaint on file. ***  History of Present Illness:   Brandi Meyers is a 45 y.o. female with a hx of SVT, coronary calcium, OSA, obesity who presents for follow-up. Started on metoprolol for SVT. Coronary calcium score elevated. OSA found.   Problem List 1. SVT -EAT vs AVNRT 2. HLD -Total cholesterol 179, HDL 30, LDL 126, triglycerides 116 3. Tobacco abuse  4. Obesity  -BMI 41 5. Coronary Calcium -CAC 2.1 (92nd percentile) 6. OSA -severe/hypoxemia   Past Medical History: Past Medical History:  Diagnosis Date   Spinal cord tumor     Past Surgical History: Past Surgical History:  Procedure Laterality Date   CHOLECYSTECTOMY     KNEE SURGERY     spinal tumor surgery      Current Medications: No outpatient medications have been marked as taking for the 07/04/21 encounter (Appointment) with Geralynn Rile, MD.     Allergies:    Patient has no known allergies.   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  Tobacco Use   Smoking status: Every Day    Packs/day: 1.00    Years: 25.00    Pack years: 25.00    Types: Cigarettes   Smokeless tobacco: Never  Substance and Sexual Activity   Alcohol use: Never   Drug use: Never   Sexual activity: Not on file  Other Topics Concern   Not on file  Social History Narrative   Not on file   Social Determinants of Health   Financial Resource Strain: Not on file  Food Insecurity: Not on file  Transportation Needs: Not on file  Physical Activity: Not on file  Stress: Not on file   Social Connections: Not on file     Family History: The patient's ***family history includes Arrhythmia in her mother; Heart attack in her maternal grandmother; Heart failure in her mother; Stroke in her father.  ROS:   All other ROS reviewed and negative. Pertinent positives noted in the HPI.     EKGs/Labs/Other Studies Reviewed:   The following studies were personally reviewed by me today:  EKG:  EKG is *** ordered today.  The ekg ordered today demonstrates ***, and was personally reviewed by me.   Zio 10/24/2020 1. Brief SVT episodes (19 in 2 weeks; longest 5.2 seconds) that appear to be ectopic atrial tachycardia vs AVNRT.  2. Symptoms occurred with normal sinus rhythm.  3. Rare ectopy.   TTE 01/25/2021  1. Left ventricular ejection fraction, by estimation, is 60 to 65%. The  left ventricle has normal function. The left ventricle has no regional  wall motion abnormalities. Left ventricular diastolic parameters were  normal.   2. Right ventricular systolic function is normal. The right ventricular  size is normal. Tricuspid regurgitation signal is inadequate for assessing  PA pressure.   3. The mitral valve is grossly normal. Trivial mitral valve  regurgitation. No evidence of mitral stenosis.   4. The aortic valve is grossly  normal. Aortic valve regurgitation is not  visualized. No aortic stenosis is present.   5. The inferior vena cava is normal in size with greater than 50%  respiratory variability, suggesting right atrial pressure of 3 mmHg.   Recent Labs: No results found for requested labs within last 8760 hours.   Recent Lipid Panel No results found for: CHOL, TRIG, HDL, CHOLHDL, VLDL, LDLCALC, LDLDIRECT  Physical Exam:   VS:  There were no vitals taken for this visit.   Wt Readings from Last 3 Encounters:  01/01/21 288 lb (130.6 kg)  09/26/20 292 lb (132.5 kg)    General: Well nourished, well developed, in no acute distress Head: Atraumatic, normal size   Eyes: PEERLA, EOMI  Neck: Supple, no JVD Endocrine: No thryomegaly Cardiac: Normal S1, S2; RRR; no murmurs, rubs, or gallops Lungs: Clear to auscultation bilaterally, no wheezing, rhonchi or rales  Abd: Soft, nontender, no hepatomegaly  Ext: No edema, pulses 2+ Musculoskeletal: No deformities, BUE and BLE strength normal and equal Skin: Warm and dry, no rashes   Neuro: Alert and oriented to person, place, time, and situation, CNII-XII grossly intact, no focal deficits  Psych: Normal mood and affect   ASSESSMENT:   Brandi Meyers is a 45 y.o. female who presents for the following: No diagnosis found.  PLAN:   There are no diagnoses linked to this encounter.  {Are you ordering a CV Procedure (e.g. stress test, cath, DCCV, TEE, etc)?   Press F2        :[id number]  Disposition: No follow-ups on file.  Medication Adjustments/Labs and Tests Ordered: Current medicines are reviewed at length with the patient today.  Concerns regarding medicines are outlined above.  No orders of the defined types were placed in this encounter.  No orders of the defined types were placed in this encounter.   There are no Patient Instructions on file for this visit.   Time Spent with Patient: I have spent a total of *** minutes with patient reviewing hospital notes, telemetry, EKGs, labs and examining the patient as well as establishing an assessment and plan that was discussed with the patient.  > 50% of time was spent in direct patient care.  Signed, Addison Naegeli. Audie Box, MD, [place]  [address], [place], [place] [address] [phone]  07/02/2021 6:58 PM

## 2021-07-04 ENCOUNTER — Ambulatory Visit: Payer: BC Managed Care – PPO | Admitting: Cardiovascular Disease

## 2021-07-04 DIAGNOSIS — E782 Mixed hyperlipidemia: Secondary | ICD-10-CM

## 2021-07-04 DIAGNOSIS — R931 Abnormal findings on diagnostic imaging of heart and coronary circulation: Secondary | ICD-10-CM

## 2021-07-04 DIAGNOSIS — G4733 Obstructive sleep apnea (adult) (pediatric): Secondary | ICD-10-CM

## 2021-07-04 DIAGNOSIS — I471 Supraventricular tachycardia: Secondary | ICD-10-CM

## 2021-07-04 DIAGNOSIS — R002 Palpitations: Secondary | ICD-10-CM

## 2021-08-17 ENCOUNTER — Other Ambulatory Visit: Payer: Self-pay

## 2021-08-17 ENCOUNTER — Encounter: Payer: Self-pay | Admitting: Cardiovascular Disease

## 2021-08-17 ENCOUNTER — Ambulatory Visit: Payer: BC Managed Care – PPO | Admitting: Cardiovascular Disease

## 2021-08-17 VITALS — BP 140/70 | HR 66 | Ht 69.0 in | Wt 295.4 lb

## 2021-08-17 DIAGNOSIS — I480 Paroxysmal atrial fibrillation: Secondary | ICD-10-CM | POA: Diagnosis not present

## 2021-08-17 DIAGNOSIS — G4733 Obstructive sleep apnea (adult) (pediatric): Secondary | ICD-10-CM

## 2021-08-17 DIAGNOSIS — R931 Abnormal findings on diagnostic imaging of heart and coronary circulation: Secondary | ICD-10-CM

## 2021-08-17 DIAGNOSIS — Z720 Tobacco use: Secondary | ICD-10-CM

## 2021-08-17 DIAGNOSIS — E782 Mixed hyperlipidemia: Secondary | ICD-10-CM

## 2021-08-17 DIAGNOSIS — I471 Supraventricular tachycardia, unspecified: Secondary | ICD-10-CM

## 2021-08-17 DIAGNOSIS — R072 Precordial pain: Secondary | ICD-10-CM

## 2021-08-17 NOTE — Progress Notes (Signed)
Cardiology Office Note:   Date:  08/17/2021  NAME:  Brandi Meyers    MRN: [id number] DOB:  1976/07/01   PCP:  Rory Percy, MD  Cardiologist:  None  Electrophysiologist:  None   Referring MD: Rory Percy, MD   Chief Complaint  Patient presents with   Follow-up        History of Present Illness:   Brandi Meyers is a 45 y.o. female with a hx of CAD, HLD, SVT who presents for follow-up. Start on metoprolol last visit. Echo normal. Calcium score elevated. She reports episodes of palpitations on 10/5 and 10/6. Afib documented on kardia mobile. No further episodes.  States she is doing well on metoprolol.  She reports that when she had the A. fib episodes her heart rates were in the 160s.  She describes chest tightness as well as sweating.  I do wonder if this represents an anginal equivalent.  She was also slightly short of breath.  She reports she did not feel well.  We did pursue calcium scoring.  She does have CAD.  She was also found to have severe sleep apnea.  In lab sleep study was recommended.  She has not completed this.  She is still smoking up to 1 pack/day.  Smoking cessation has been advised.  We also started her on Crestor.  She is hesitant to go on aspirin.  She apparently did not start her Crestor.  I did explain to her that given her elevated coronary calcium for young age that she needs to be on a statin.  She will start this.  We will recheck a lipid profile just to make sure there is been no change.  She is exercising.  No exertional chest pressure symptoms reported.  Symptoms appear to be related to A. fib.  No further A. fib episodes.  Problem List 1. Afib  -pAF captured on Kardia mobile  -CHADSVASC=1 (woman) 2. SVT -EAT vs AVNRT 3. HLD -Total cholesterol 179, HDL 30, LDL 126, triglycerides 116 4. Tobacco abuse  5. Obesity  -BMI 41 6. Coronary calcium  -score 2.1 (92nd percentile) 7. OSA -severe sleep apnea  -BMI 44  Past Medical History: Past Medical History:   Diagnosis Date   Spinal cord tumor     Past Surgical History: Past Surgical History:  Procedure Laterality Date   CHOLECYSTECTOMY     KNEE SURGERY     spinal tumor surgery      Current Medications: Current Meds  Medication Sig   albuterol (VENTOLIN HFA) 108 (90 Base) MCG/ACT inhaler TAKE 2 PUFFS BY MOUTH 4 TIMES A DAY   b complex vitamins capsule Take by mouth.   Cholecalciferol 25 MCG (1000 UT) tablet Take by mouth.   DULoxetine (CYMBALTA) 20 MG capsule Take 20 mg by mouth daily.   ibuprofen (ADVIL) 100 MG/5ML suspension Take 200 mg by mouth every 4 (four) hours as needed.   metFORMIN (GLUCOPHAGE-XR) 750 MG 24 hr tablet Take 2 tablets by mouth every morning.   metoprolol succinate (TOPROL-XL) 25 MG 24 hr tablet TAKE 1 TABLET (25 MG TOTAL) BY MOUTH DAILY.   Multiple Vitamin (MULTIVITAMIN WITH MINERALS) TABS tablet Take 1 tablet by mouth daily.   triamcinolone (KENALOG) 0.1 % APPLY TO AFFECTED AREA TWICE A DAY   Zinc 50 MG TABS Take by mouth.     Allergies:    Patient has no known allergies.   Social History: Social History   Socioeconomic History   Marital status: Married  Spouse name: Not on file   Number of children: 0   Years of education: Not on file   Highest education level: Not on file  Occupational History   Occupation: teahcer's assistannt  Tobacco Use   Smoking status: Every Day    Packs/day: 1.00    Years: 25.00    Pack years: 25.00    Types: Cigarettes   Smokeless tobacco: Never  Substance and Sexual Activity   Alcohol use: Never   Drug use: Never   Sexual activity: Not on file  Other Topics Concern   Not on file  Social History Narrative   Not on file   Social Determinants of Health   Financial Resource Strain: Not on file  Food Insecurity: Not on file  Transportation Needs: Not on file  Physical Activity: Not on file  Stress: Not on file  Social Connections: Not on file     Family History: The patient's family history includes  Arrhythmia in her mother; Heart attack in her maternal grandmother; Heart failure in her mother; Stroke in her father.  ROS:   All other ROS reviewed and negative. Pertinent positives noted in the HPI.     EKGs/Labs/Other Studies Reviewed:   The following studies were personally reviewed by me today:   [place] 10/29/2020 1. Brief SVT episodes (19 in 2 weeks; longest 5.2 seconds) that appear to be ectopic atrial tachycardia vs AVNRT.  2. Symptoms occurred with normal sinus rhythm.  3. Rare ectopy.   TTE 01/25/2021  1. Left ventricular ejection fraction, by estimation, is 60 to 65%. The  left ventricle has normal function. The left ventricle has no regional  wall motion abnormalities. Left ventricular diastolic parameters were  normal.   2. Right ventricular systolic function is normal. The right ventricular  size is normal. Tricuspid regurgitation signal is inadequate for assessing  PA pressure.   3. The mitral valve is grossly normal. Trivial mitral valve  regurgitation. No evidence of mitral stenosis.   4. The aortic valve is grossly normal. Aortic valve regurgitation is not  visualized. No aortic stenosis is present.   5. The inferior vena cava is normal in size with greater than 50%  respiratory variability, suggesting right atrial pressure of 3 mmHg.   TTE 12/5/  Recent Labs: No results found for requested labs within last 8760 hours.   Recent Lipid Panel No results found for: CHOL, TRIG, HDL, CHOLHDL, VLDL, LDLCALC, LDLDIRECT  Physical Exam:   VS:  BP 140/70   Pulse 66   Ht 5\' 9"  (1.753 m)   Wt 295 lb 6.4 oz (134 kg)   SpO2 99%   BMI 43.62 kg/m    Wt Readings from Last 3 Encounters:  08/17/21 295 lb 6.4 oz (134 kg)  01/01/21 288 lb (130.6 kg)  09/26/20 292 lb (132.5 kg)    General: Well nourished, well developed, in no acute distress Head: Atraumatic, normal size  Eyes: PEERLA, EOMI  Neck: Supple, no JVD Endocrine: No thryomegaly Cardiac: Normal S1, S2; RRR; no  murmurs, rubs, or gallops Lungs: Clear to auscultation bilaterally, no wheezing, rhonchi or rales  Abd: Soft, nontender, no hepatomegaly  Ext: No edema, pulses 2+ Musculoskeletal: No deformities, BUE and BLE strength normal and equal Skin: Warm and dry, no rashes   Neuro: Alert and oriented to person, place, time, and situation, CNII-XII grossly intact, no focal deficits  Psych: Normal mood and affect   ASSESSMENT:   SKYLYN SLEZAK is a 46 y.o. female who  presents for the following: 1. Paroxysmal atrial fibrillation (HCC)   2. SVT (supraventricular tachycardia) ([place])   3. Agatston coronary artery calcium score less than 100   4. Mixed hyperlipidemia   5. Precordial pain   6. Tobacco abuse   7. OSA (obstructive sleep apnea)   8. Obesity, morbid, BMI 40.0-49.9 ([place])     PLAN:   1. Paroxysmal atrial fibrillation (HCC) -She has clear episodes of paroxysmal A. fib on her monitor.  She has a cardia mobile.  CHA2DS2-VASc score is 1.  No anticoagulation is recommended.  Even with her elevated calcium score she would not merit anticoagulation.  Echocardiogram is normal.  No further episodes.  Seems to be been an isolated event in the last 6 months. -She is also been found to have severe sleep apnea.  Needs an in lab study.  I suspect her sleep apnea is her trigger.  I recommended she get her sleep study completed and then see if A. fib is still occurring.  For now we will continue beta-blocker.  She may need to be considered for cardiac ablation however I think her trigger is likely sleep apnea.  2. SVT (supraventricular tachycardia) (HCC) -SVT in the past.  Now with A. fib.  3. Agatston coronary artery calcium score less than 100 4. Mixed hyperlipidemia 5. Precordial pain -When she has A. fib episodes she does have chest tightness as well as sweating and nausea.  Given that she has an elevated coronary calcium score for age I would like to proceed with coronary CTA.  We will obtain full lab work  today including BMP.  She will take 2 metoprolol tartrate to has before the scan.  I would like to make sure she does not have soft plaque we could be missing on her calcium score.  I would also like to recheck lipid profile.  She has not started her Crestor.  She would like to know where her level is before starting this.  Regardless she needs to be on Crestor.  6. Tobacco abuse -4 minutes of smoking cessation counseling provided.  She really needs to quit.  7. OSA (obstructive sleep apnea) 8. Obesity, morbid, BMI 40.0-49.9 (HCC) -Severe sleep apnea found.  Weight loss recommended.  Also needs to complete her in lab study as well as get fitted for CPAP machine.  Disposition: Return in about 3 months (around 11/17/2021).  Medication Adjustments/Labs and Tests Ordered: Current medicines are reviewed at length with the patient today.  Concerns regarding medicines are outlined above.  Orders Placed This Encounter  Procedures   CT CORONARY MORPH W/CTA COR W/SCORE W/CA W/CM &/OR WO/CM   Lipid panel   Basic metabolic panel   No orders of the defined types were placed in this encounter.   Patient Instructions  Medication Instructions:  Take two of your metoprolol doses two hours before the CT scan when scheduled.   *If you need a refill on your cardiac medications before your next appointment, please call your pharmacy*   Lab Work: BMET, LIPID today  If you have labs (blood work) drawn today and your tests are completely normal, you will receive your results only by: [place] (if you have MyChart) OR A paper copy in the mail If you have any lab test that is abnormal or we need to change your treatment, we will call you to review the results.   Testing/Procedures: Your physician has requested that you have cardiac CT. Cardiac computed tomography (CT) is a painless  test that uses an x-ray machine to take clear, detailed pictures of your heart. For further information please visit  [email or web address]. Please follow instruction sheet as given.   Follow-Up: At [facility], you and your health needs are our priority.  As part of our continuing mission to provide you with exceptional heart care, we have created designated Provider Care Teams.  These Care Teams include your primary Cardiologist (physician) and Advanced Practice Providers (APPs -  Physician Assistants and Nurse Practitioners) who all work together to provide you with the care you need, when you need it.  We recommend signing up for the patient portal called "MyChart".  Sign up information is provided on this After Visit Summary.  MyChart is used to connect with patients for Virtual Visits (Telemedicine).  Patients are able to view lab/test results, encounter notes, upcoming appointments, etc.  Non-urgent messages can be sent to your provider as well.   To learn more about what you can do with MyChart, go to [email or web address].    Your next appointment:   3 month(s)  The format for your next appointment:   In Person  Provider:   Eleonore Chiquito, MD   Other Instructions   Your cardiac CT will be scheduled at one of the below locations:   [facility] [address] [place], [place] [address] [phone]  If scheduled at [facility], please arrive at the [facility] main entrance (entrance A) of [facility] 30 minutes prior to test start time. You can use the FREE valet parking offered at the main entrance (encouraged to control the heart rate for the test) Proceed to the [facility] Radiology Department (first floor) to check-in and test prep.   Please follow these instructions carefully (unless otherwise directed):  Hold all erectile dysfunction medications at least 3 days (72 hrs) prior to test.  On the Night Before the Test: Be sure to Drink plenty of water. Do not consume any caffeinated/decaffeinated beverages or chocolate 12 hours prior to your test. Do  not take any antihistamines 12 hours prior to your test.  On the Day of the Test: Drink plenty of water until 1 hour prior to the test. Do not eat any food 4 hours prior to the test. You may take your regular medications prior to the test.  Take metoprolol (Lopressor) two hours prior to test. HOLD Furosemide/Hydrochlorothiazide morning of the test. FEMALES- please wear underwire-free bra if available, avoid dresses & tight clothing  After the Test: Drink plenty of water. After receiving IV contrast, you may experience a mild flushed feeling. This is normal. On occasion, you may experience a mild rash up to 24 hours after the test. This is not dangerous. If this occurs, you can take Benadryl 25 mg and increase your fluid intake. If you experience trouble breathing, this can be serious. If it is severe call 911 IMMEDIATELY. If it is mild, please call our office. If you take any of these medications: Glipizide/Metformin, Avandament, Glucavance, please do not take 48 hours after completing test unless otherwise instructed.  Please allow 2-4 weeks for scheduling of routine cardiac CTs. Some insurance companies require a pre-authorization which may delay scheduling of this test.   For non-scheduling related questions, please contact the cardiac imaging nurse navigator should you have any questions/concerns: Marchia Bond, Cardiac Imaging Nurse Navigator Gordy Clement, Cardiac Imaging Nurse Navigator [facility] Direct Office Dial: [phone]   For scheduling needs, including cancellations and rescheduling,  please call [place], [phone]. g    Time Spent with Patient: I have spent a total of 35 minutes with patient reviewing hospital notes, telemetry, EKGs, labs and examining the patient as well as establishing an assessment and plan that was discussed with the patient.  > 50% of time was spent in direct patient care.  Signed, Addison Naegeli. Audie Box, MD, [place]  [address], [place], [place] [address] [phone]  08/17/2021 4:55 PM

## 2021-08-17 NOTE — Patient Instructions (Signed)
Medication Instructions:  Take two of your metoprolol doses two hours before the CT scan when scheduled.   *If you need a refill on your cardiac medications before your next appointment, please call your pharmacy*   Lab Work: BMET, LIPID today  If you have labs (blood work) drawn today and your tests are completely normal, you will receive your results only by: [place] (if you have MyChart) OR A paper copy in the mail If you have any lab test that is abnormal or we need to change your treatment, we will call you to review the results.   Testing/Procedures: Your physician has requested that you have cardiac CT. Cardiac computed tomography (CT) is a painless test that uses an x-ray machine to take clear, detailed pictures of your heart. For further information please visit [email or web address]. Please follow instruction sheet as given.   Follow-Up: At [facility], you and your health needs are our priority.  As part of our continuing mission to provide you with exceptional heart care, we have created designated Provider Care Teams.  These Care Teams include your primary Cardiologist (physician) and Advanced Practice Providers (APPs -  Physician Assistants and Nurse Practitioners) who all work together to provide you with the care you need, when you need it.  We recommend signing up for the patient portal called "MyChart".  Sign up information is provided on this After Visit Summary.  MyChart is used to connect with patients for Virtual Visits (Telemedicine).  Patients are able to view lab/test results, encounter notes, upcoming appointments, etc.  Non-urgent messages can be sent to your provider as well.   To learn more about what you can do with MyChart, go to [email or web address].    Your next appointment:   3 month(s)  The format for your next appointment:   In Person  Provider:   Eleonore Chiquito, MD   Other Instructions   Your cardiac CT will be scheduled at one of  the below locations:   [facility] [address] [place], [place] [address] [phone]  If scheduled at [facility], please arrive at the [facility] main entrance (entrance A) of [facility] 30 minutes prior to test start time. You can use the FREE valet parking offered at the main entrance (encouraged to control the heart rate for the test) Proceed to the [facility] Radiology Department (first floor) to check-in and test prep.   Please follow these instructions carefully (unless otherwise directed):  Hold all erectile dysfunction medications at least 3 days (72 hrs) prior to test.  On the Night Before the Test: Be sure to Drink plenty of water. Do not consume any caffeinated/decaffeinated beverages or chocolate 12 hours prior to your test. Do not take any antihistamines 12 hours prior to your test.  On the Day of the Test: Drink plenty of water until 1 hour prior to the test. Do not eat any food 4 hours prior to the test. You may take your regular medications prior to the test.  Take metoprolol (Lopressor) two hours prior to test. HOLD Furosemide/Hydrochlorothiazide morning of the test. FEMALES- please wear underwire-free bra if available, avoid dresses & tight clothing  After the Test: Drink plenty of water. After receiving IV contrast, you may experience a mild flushed feeling. This is normal. On occasion, you may experience a mild rash up to 24 hours after the test. This is not dangerous. If this occurs, you can take Benadryl 25 mg and increase your fluid  intake. If you experience trouble breathing, this can be serious. If it is severe call 911 IMMEDIATELY. If it is mild, please call our office. If you take any of these medications: Glipizide/Metformin, Avandament, Glucavance, please do not take 48 hours after completing test unless otherwise instructed.  Please allow 2-4 weeks for scheduling of routine cardiac CTs. Some insurance companies  require a pre-authorization which may delay scheduling of this test.   For non-scheduling related questions, please contact the cardiac imaging nurse navigator should you have any questions/concerns: Marchia Bond, Cardiac Imaging Nurse Navigator Gordy Clement, Cardiac Imaging Nurse Navigator [place] [facility] Direct Office Dial: [phone]   For scheduling needs, including cancellations and rescheduling, please call [place], [phone]. g

## 2021-08-18 LAB — LIPID PANEL
Chol/HDL Ratio: 5.7 ratio — ABNORMAL HIGH (ref 0.0–4.4)
Cholesterol, Total: 170 mg/dL (ref 100–199)
HDL: 30 mg/dL — ABNORMAL LOW (ref >39)
LDL Chol Calc (NIH): 113 mg/dL — ABNORMAL HIGH (ref 0–99)
Triglycerides: 152 mg/dL — ABNORMAL HIGH (ref 0–149)
VLDL Cholesterol Cal: 27 mg/dL (ref 5–40)

## 2021-08-18 LAB — BASIC METABOLIC PANEL
BUN/Creatinine Ratio: 10 (ref 9–23)
BUN: 6 mg/dL (ref 6–24)
CO2: 24 mmol/L (ref 20–29)
Calcium: 9.4 mg/dL (ref 8.7–10.2)
Chloride: 104 mmol/L (ref 96–106)
Creatinine, Ser: 0.62 mg/dL (ref 0.57–1.00)
Glucose: 89 mg/dL (ref 70–99)
Potassium: 4.5 mmol/L (ref 3.5–5.2)
Sodium: 142 mmol/L (ref 134–144)
eGFR: 111 mL/min/{1.73_m2} (ref >59)

## 2021-11-14 ENCOUNTER — Encounter (HOSPITAL_COMMUNITY): Payer: Self-pay | Admitting: Emergency Medicine

## 2021-12-06 NOTE — Progress Notes (Deleted)
Cardiology Office Note:   Date:  12/06/2021  NAME:  Brandi Meyers    MRN: [id number] DOB:  Nov 22, 1975   PCP:  Rory Percy, MD  Cardiologist:  None  Electrophysiologist:  None   Referring MD: Rory Percy, MD   No chief complaint on file. ***  History of Present Illness:   Brandi Meyers is a 46 y.o. female with a hx of pAF, coronary calcium, obesity, severe OSA, HLD who presents for follow-up.   Needs follow-up chest CT for lung nodule.   Problem List 1. Afib  -pAF captured on Kardia mobile  -CHADSVASC=1 (woman) 2. SVT -EAT vs AVNRT 3. HLD -Total cholesterol 179, HDL 30, LDL 126, triglycerides 116 4. Tobacco abuse  5. Obesity  -BMI 41 6. Coronary calcium  -score 2.1 (92nd percentile) 7. OSA -severe sleep apnea  -BMI 44  Past Medical History: Past Medical History:  Diagnosis Date   Spinal cord tumor     Past Surgical History: Past Surgical History:  Procedure Laterality Date   CHOLECYSTECTOMY     KNEE SURGERY     spinal tumor surgery      Current Medications: No outpatient medications have been marked as taking for the 12/07/21 encounter (Appointment) with Geralynn Rile, MD.     Allergies:    Patient has no known allergies.   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  Tobacco Use   Smoking status: Every Day    Packs/day: 1.00    Years: 25.00    Pack years: 25.00    Types: Cigarettes   Smokeless tobacco: Never  Substance and Sexual Activity   Alcohol use: Never   Drug use: Never   Sexual activity: Not on file  Other Topics Concern   Not on file  Social History Narrative   Not on file   Social Determinants of Health   Financial Resource Strain: Not on file  Food Insecurity: Not on file  Transportation Needs: Not  on file  Physical Activity: Not on file  Stress: Not on file  Social Connections: Not on file     Family History: The patient's ***family history includes Arrhythmia in her mother; Heart attack in her maternal grandmother; Heart failure in her mother; Stroke in her father.  ROS:   All other ROS reviewed and negative. Pertinent positives noted in the HPI.     EKGs/Labs/Other Studies Reviewed:   The following studies were personally reviewed by me today:  EKG:  EKG is *** ordered today.  The ekg ordered today demonstrates ***, and was personally reviewed by me.   CAC Score 01/25/2021 IMPRESSION: Coronary calcium score of 2.12. This was 92nd percentile for age-, race-, and sex-matched controls.  Recent Labs: 08/17/2021: BUN 6; Creatinine, Ser 0.62; Potassium 4.5; Sodium 142   Recent Lipid Panel    Component Value Date/Time   CHOL 170 08/17/2021 1645   TRIG 152 (H) 08/17/2021 1645   HDL 30 (L) 08/17/2021 1645   CHOLHDL 5.7 (H) 08/17/2021 1645   LDLCALC 113 (H) 08/17/2021 1645    Physical Exam:   VS:  There were no vitals taken for this visit.   Wt Readings from Last 3 Encounters:  08/17/21 295 lb 6.4 oz (134 kg)  01/01/21 288 lb (130.6 kg)  09/26/20 292 lb (132.5 kg)    General: Well nourished, well developed, in no acute distress Head: Atraumatic, normal size  Eyes: PEERLA, EOMI  Neck: Supple, no JVD Endocrine: No thryomegaly Cardiac: Normal S1, S2; RRR; no murmurs, rubs, or gallops Lungs: Clear to auscultation bilaterally, no wheezing, rhonchi or rales  Abd: Soft, nontender, no hepatomegaly  Ext: No edema, pulses 2+ Musculoskeletal: No deformities, BUE and BLE strength normal and equal Skin: Warm and dry, no rashes   Neuro: Alert and oriented to person, place, time, and situation, CNII-XII grossly intact, no focal deficits  Psych: Normal mood and affect   ASSESSMENT:   [name] is a 46 y.o. female who presents for the following: No diagnosis found.  PLAN:    There are no diagnoses linked to this encounter.  {Are you ordering a CV Procedure (e.g. stress test, cath, DCCV, TEE, etc)?   Press F2        :[id number]}  Disposition: No follow-ups on file.  Medication Adjustments/Labs and Tests Ordered: Current medicines are reviewed at length with the patient today.  Concerns regarding medicines are outlined above.  No orders of the defined types were placed in this encounter.  No orders of the defined types were placed in this encounter.   There are no Patient Instructions on file for this visit.   Time Spent with Patient: I have spent a total of *** minutes with patient reviewing hospital notes, telemetry, EKGs, labs and examining the patient as well as establishing an assessment and plan that was discussed with the patient.  > 50% of time was spent in direct patient care.  Signed, Addison Naegeli. Audie Box, MD, [place]  [address], [place], [place] [address] [phone]  12/06/2021 8:07 AM

## 2021-12-07 ENCOUNTER — Ambulatory Visit: Payer: BC Managed Care – PPO | Admitting: Cardiovascular Disease

## 2021-12-07 DIAGNOSIS — E782 Mixed hyperlipidemia: Secondary | ICD-10-CM

## 2021-12-07 DIAGNOSIS — G4733 Obstructive sleep apnea (adult) (pediatric): Secondary | ICD-10-CM

## 2021-12-07 DIAGNOSIS — R931 Abnormal findings on diagnostic imaging of heart and coronary circulation: Secondary | ICD-10-CM

## 2021-12-07 DIAGNOSIS — I480 Paroxysmal atrial fibrillation: Secondary | ICD-10-CM

## 2021-12-07 DIAGNOSIS — I471 Supraventricular tachycardia: Secondary | ICD-10-CM

## 2022-01-29 NOTE — Progress Notes (Deleted)
?Cardiology Office Note:   ?Date:  01/29/2022  ?NAME:  Brandi Meyers    ?MRN: [id number] ?DOB:  [birth year]  ? ?PCP:  Rory Percy, MD  ?Cardiologist:  None  ?Electrophysiologist:  None  ? ?Referring MD: Rory Percy, MD  ? ?No chief complaint on file. ?*** ? ?History of Present Illness:   ?Brandi Meyers is a 46 y.o. female with a hx of pAF, SVT, tobacco abuse, OSA, coronary calcium who presents for follow-up. CCTA ordered but not completed.  ? ?Problem List ?1. Afib  ?-pAF captured on Kardia mobile  ?-CHADSVASC=1 (woman) ?2. SVT ?-EAT vs AVNRT ?3. HLD ?-Total cholesterol 179, HDL 30, LDL 126, triglycerides 116 ?4. Tobacco abuse  ?5. Obesity  ?-BMI 41 ?6. Coronary calcium  ?-score 2.1 (92nd percentile) ?7. OSA ?-severe sleep apnea  ?-BMI 44 ? ?Past Medical History: ?Past Medical History:  ?Diagnosis Date  ? Spinal cord tumor   ? ? ?Past Surgical History: ?Past Surgical History:  ?Procedure Laterality Date  ? CHOLECYSTECTOMY    ? KNEE SURGERY    ? spinal tumor surgery    ? ? ?Current Medications: ?No outpatient medications have been marked as taking for the 01/30/22 encounter (Appointment) with O'Neal, Cassie Freer, MD.  ?  ? ?Allergies:    ?Patient has no known allergies.  ? ?Social History: ?Social History  ? ?Socioeconomic History  ? Marital status: Married  ?  Spouse name: Not on file  ? Number of children: 0  ? Years of education: Not on file  ? Highest education level: Not on file  ?Occupational History  ? 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 Smoking status: Every Day  ?  Packs/day: 1.00  ?  Years: 25.00  ?  Pack years: 25.00  ?  Types: Cigarettes  ? Smokeless tobacco: Never  ?Substance and Sexual Activity  ? Alcohol use: Never  ? Drug use: Never  ? Sexual activity: Not on file  ?Other Topics Concern  ? Not on file  ?Social History Narrative  ? Not on file  ? ?Social Determinants of Health  ? ?Financial Resource Strain: Not on file  ?Food Insecurity: Not on file  ?Transportation Needs: Not on file   ?Physical Activity: Not on file  ?Stress: Not on file  ?Social Connections: Not on file  ?  ? ?Family History: ?The patient's ***family history includes Arrhythmia in her mother; Heart attack in her maternal grandmother; Heart failure in her mother; Stroke in her father. ? ?ROS:   ?All other ROS reviewed and negative. Pertinent positives noted in the HPI.    ? ?EKGs/Labs/Other Studies Reviewed:   ?The following studies were personally reviewed by me today: ? ?EKG:  EKG is *** ordered today.  The ekg ordered today demonstrates ***, and was personally reviewed by me.  ? ?Recent Labs: ?08/17/2021: BUN 6; Creatinine, Ser 0.62; Potassium 4.5; Sodium 142  ? ?Recent Lipid Panel ?   ?Component Value Date/Time  ? CHOL 170 08/17/2021 1645  ? TRIG 152 (H) 08/17/2021 1645  ? HDL 30 (L) 08/17/2021 1645  ? CHOLHDL 5.7 (H) 08/17/2021 1645  ? [place] 113 (H) 08/17/2021 1645  ? ? ?Physical Exam:   ?VS:  There were no vitals taken for this visit.   ?Wt Readings from Last 3 Encounters:  ?08/17/21 295 lb 6.4 oz (134 kg)  ?01/01/21 288 lb (130.6 kg)  ?09/26/20 292 lb (132.5 kg)  ?  ?General: Well nourished, well developed, in no acute distress ?Head:  Atraumatic, normal size  ?Eyes: PEERLA, EOMI  ?Neck: Supple, no JVD ?Endocrine: No thryomegaly ?Cardiac: Normal S1, S2; RRR; no murmurs, rubs, or gallops ?Lungs: Clear to auscultation bilaterally, no wheezing, rhonchi or rales  ?Abd: Soft, nontender, no hepatomegaly  ?Ext: No edema, pulses 2+ ?Musculoskeletal: No deformities, BUE and BLE strength normal and equal ?Skin: Warm and dry, no rashes   ?Neuro: Alert and oriented to person, place, time, and situation, CNII-XII grossly intact, no focal deficits  ?Psych: Normal mood and affect  ? ?ASSESSMENT:   ?BROOKLYNN BRANDENBURG is a 46 y.o. female who presents for the following: ?No diagnosis found. ? ?PLAN:   ?There are no diagnoses linked to this encounter. ? ?{Are you ordering a CV Procedure (e.g. stress test, cath, DCCV, TEE, etc)?   Press F2         :[id number]} ? ?Disposition: No follow-ups on file. ? ?Medication Adjustments/Labs and Tests Ordered: ?Current medicines are reviewed at length with the patient today.  Concerns regarding medicines are outlined above.  ?No orders of the defined types were placed in this encounter. ? ?No orders of the defined types were placed in this encounter. ? ? ?There are no Patient Instructions on file for this visit.  ? ?Time Spent with Patient: I have spent a total of *** minutes with patient reviewing hospital notes, telemetry, EKGs, labs and examining the patient as well as establishing an assessment and plan that was discussed with the patient.  > 50% of time was spent in direct patient care. ? ?Signed, ?[place] T. Audie Box, MD, [facility] ?[place]  ?[address], [address] ?[place], [place] [address] ?[phone]  ?01/29/2022 8:13 AM    ? ?

## 2022-01-30 ENCOUNTER — Ambulatory Visit: Payer: BC Managed Care – PPO | Admitting: Cardiovascular Disease

## 2022-01-30 DIAGNOSIS — E782 Mixed hyperlipidemia: Secondary | ICD-10-CM

## 2022-01-30 DIAGNOSIS — I480 Paroxysmal atrial fibrillation: Secondary | ICD-10-CM

## 2022-01-30 DIAGNOSIS — R931 Abnormal findings on diagnostic imaging of heart and coronary circulation: Secondary | ICD-10-CM

## 2022-01-30 DIAGNOSIS — I471 Supraventricular tachycardia: Secondary | ICD-10-CM

## 2022-03-25 ENCOUNTER — Other Ambulatory Visit: Payer: Self-pay | Admitting: Cardiovascular Disease

## 2022-03-31 NOTE — Progress Notes (Deleted)
Cardiology Office Note:   Date:  03/31/2022  NAME:  Brandi Meyers    MRN: [id number] DOB:  10-10-1976   PCP:  Rory Percy, MD  Cardiologist:  None  Electrophysiologist:  None   Referring MD: Rory Percy, MD   No chief complaint on file. ***  History of Present Illness:   Brandi Meyers is a 46 y.o. female with a hx of pAF, HLD, tobacco abuse, OSA who presents for follow-up.    Problem List 1. Paroxysmal Afib  -pAF captured on Kardia mobile  -CHADSVASC=2 (female, coronary calcium) 2. SVT -EAT vs AVNRT 3. HLD -Total cholesterol 179, HDL 30, LDL 126, triglycerides 116 4. Tobacco abuse  5. Obesity  -BMI 41 6. Coronary calcium  -score 2.1 (92nd percentile) 7. OSA -severe sleep apnea  -BMI 44  Past Medical History: Past Medical History:  Diagnosis Date   Spinal cord tumor     Past Surgical History: Past Surgical History:  Procedure Laterality Date   CHOLECYSTECTOMY     KNEE SURGERY     spinal tumor surgery      Current Medications: No outpatient medications have been marked as taking for the 04/03/22 encounter (Appointment) with Geralynn Rile, MD.     Allergies:    Patient has no known allergies.   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  Tobacco Use   Smoking status: Every Day    Packs/day: 1.00    Years: 25.00    Pack years: 25.00    Types: Cigarettes   Smokeless tobacco: Never  Substance and Sexual Activity   Alcohol use: Never   Drug use: Never   Sexual activity: Not on file  Other Topics Concern   Not on file  Social History Narrative   Not on file   Social Determinants of Health   Financial Resource Strain: Not on file  Food Insecurity: Not on file  Transportation Needs: Not on file  Physical Activity: Not  on file  Stress: Not on file  Social Connections: Not on file     Family History: The patient's ***family history includes Arrhythmia in her mother; Heart attack in her maternal grandmother; Heart failure in her mother; Stroke in her father.  ROS:   All other ROS reviewed and negative. Pertinent positives noted in the HPI.     EKGs/Labs/Other Studies Reviewed:   The following studies were personally reviewed by me today:  EKG:  EKG is *** ordered today.  The ekg ordered today demonstrates ***, and was personally reviewed by me.   TTE 01/25/2021  1. Left ventricular ejection fraction, by estimation, is 60 to 65%. The  left ventricle has normal function. The left ventricle has no regional  wall motion abnormalities. Left ventricular diastolic parameters were  normal.   2. Right ventricular systolic function is normal. The right ventricular  size is normal. Tricuspid regurgitation signal is inadequate for assessing  PA pressure.   3. The mitral valve is grossly normal. Trivial mitral valve  regurgitation. No evidence of mitral stenosis.   4. The aortic valve is grossly normal. Aortic valve regurgitation is not  visualized. No aortic stenosis is present.   5. The inferior vena cava is normal in size with greater than  50%  respiratory variability, suggesting right atrial pressure of 3 mmHg.  Recent Labs: 08/17/2021: BUN 6; Creatinine, Ser 0.62; Potassium 4.5; Sodium 142   Recent Lipid Panel    Component Value Date/Time   CHOL 170 08/17/2021 1645   TRIG 152 (H) 08/17/2021 1645   HDL 30 (L) 08/17/2021 1645   CHOLHDL 5.7 (H) 08/17/2021 1645   LDLCALC 113 (H) 08/17/2021 1645    Physical Exam:   VS:  There were no vitals taken for this visit.   Wt Readings from Last 3 Encounters:  08/17/21 295 lb 6.4 oz (134 kg)  01/01/21 288 lb (130.6 kg)  09/26/20 292 lb (132.5 kg)    General: Well nourished, well developed, in no acute distress Head: Atraumatic, normal size  Eyes: PEERLA,  EOMI  Neck: Supple, no JVD Endocrine: No thryomegaly Cardiac: Normal S1, S2; RRR; no murmurs, rubs, or gallops Lungs: Clear to auscultation bilaterally, no wheezing, rhonchi or rales  Abd: Soft, nontender, no hepatomegaly  Ext: No edema, pulses 2+ Musculoskeletal: No deformities, BUE and BLE strength normal and equal Skin: Warm and dry, no rashes   Neuro: Alert and oriented to person, place, time, and situation, CNII-XII grossly intact, no focal deficits  Psych: Normal mood and affect   ASSESSMENT:   ARDITH TEST is a 46 y.o. female who presents for the following: No diagnosis found.  PLAN:   There are no diagnoses linked to this encounter.  {Are you ordering a CV Procedure (e.g. stress test, cath, DCCV, TEE, etc)?   Press F2        :[id number]}  Disposition: No follow-ups on file.  Medication Adjustments/Labs and Tests Ordered: Current medicines are reviewed at length with the patient today.  Concerns regarding medicines are outlined above.  No orders of the defined types were placed in this encounter.  No orders of the defined types were placed in this encounter.   There are no Patient Instructions on file for this visit.   Time Spent with Patient: I have spent a total of *** minutes with patient reviewing hospital notes, telemetry, EKGs, labs and examining the patient as well as establishing an assessment and plan that was discussed with the patient.  > 50% of time was spent in direct patient care.  Signed, Addison Naegeli. Audie Box, MD, [place]  [address], [place], [place] [address] [phone]  03/31/2022 1:11 PM

## 2022-04-03 ENCOUNTER — Ambulatory Visit: Payer: BC Managed Care – PPO | Admitting: Cardiovascular Disease

## 2022-04-03 DIAGNOSIS — I480 Paroxysmal atrial fibrillation: Secondary | ICD-10-CM

## 2022-04-03 DIAGNOSIS — Z720 Tobacco use: Secondary | ICD-10-CM

## 2022-04-03 DIAGNOSIS — R931 Abnormal findings on diagnostic imaging of heart and coronary circulation: Secondary | ICD-10-CM

## 2022-04-03 DIAGNOSIS — E782 Mixed hyperlipidemia: Secondary | ICD-10-CM

## 2022-04-03 DIAGNOSIS — I471 Supraventricular tachycardia: Secondary | ICD-10-CM

## 2022-04-17 NOTE — Progress Notes (Deleted)
Cardiology Office Note:   Date:  04/17/2022  NAME:  Brandi Meyers    MRN: [id number] DOB:  1976/01/27   PCP:  Rory Percy, MD  Cardiologist:  None  Electrophysiologist:  None   Referring MD: Rory Percy, MD   No chief complaint on file. ***  History of Present Illness:   Brandi Meyers is a 46 y.o. female with a hx of atrial fibrillation, SVT, tobacco abuse, obesity, sleep apnea who presents for follow-up.  Problem List 1. Afib  -pAF captured on Kardia mobile  -CHADSVASC=1 (woman) 2. SVT -EAT vs AVNRT 3. HLD -Total cholesterol 179, HDL 30, LDL 126, triglycerides 116 4. Tobacco abuse  5. Obesity  -BMI 41 6. Coronary calcium  -score 2.1 (92nd percentile) 7. OSA -severe sleep apnea  -BMI 44  Past Medical History: Past Medical History:  Diagnosis Date   Spinal cord tumor     Past Surgical History: Past Surgical History:  Procedure Laterality Date   CHOLECYSTECTOMY     KNEE SURGERY     spinal tumor surgery      Current Medications: No outpatient medications have been marked as taking for the 04/18/22 encounter (Appointment) with Geralynn Rile, MD.     Allergies:    Patient has no known allergies.   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  Tobacco Use   Smoking status: Every Day    Packs/day: 1.00    Years: 25.00    Total pack years: 25.00    Types: Cigarettes   Smokeless tobacco: Never  Substance and Sexual Activity   Alcohol use: Never   Drug use: Never   Sexual activity: Not on file  Other Topics Concern   Not on file  Social History Narrative   Not on file   Social Determinants of Health   Financial Resource Strain: Not on file  Food Insecurity: Not on file  Transportation Needs: Not on file  Physical  Activity: Not on file  Stress: Not on file  Social Connections: Not on file     Family History: The patient's ***family history includes Arrhythmia in her mother; Heart attack in her maternal grandmother; Heart failure in her mother; Stroke in her father.  ROS:   All other ROS reviewed and negative. Pertinent positives noted in the HPI.     EKGs/Labs/Other Studies Reviewed:   The following studies were personally reviewed by me today:  EKG:  EKG is *** ordered today.  The ekg ordered today demonstrates ***, and was personally reviewed by me.   TTE 01/25/2021  1. Left ventricular ejection fraction, by estimation, is 60 to 65%. The  left ventricle has normal function. The left ventricle has no regional  wall motion abnormalities. Left ventricular diastolic parameters were  normal.   2. Right ventricular systolic function is normal. The right ventricular  size is normal. Tricuspid regurgitation signal is inadequate for assessing  PA pressure.   3. The mitral valve is grossly normal. Trivial mitral valve  regurgitation. No evidence of mitral stenosis.   4. The aortic valve is grossly normal. Aortic valve regurgitation is not  visualized. No aortic stenosis is present.   5. The inferior vena cava is normal in size with greater than 50%  respiratory variability, suggesting right atrial pressure of 3 mmHg.   Recent Labs: 08/17/2021: BUN 6; Creatinine, Ser 0.62; Potassium 4.5; Sodium 142   Recent Lipid Panel    Component Value Date/Time   CHOL 170 08/17/2021 1645   TRIG 152 (H) 08/17/2021 1645   HDL 30 (L) 08/17/2021 1645   CHOLHDL 5.7 (H) 08/17/2021 1645   LDLCALC 113 (H) 08/17/2021 1645    Physical Exam:   VS:  There were no vitals taken for this visit.   Wt Readings from Last 3 Encounters:  08/17/21 295 lb 6.4 oz (134 kg)  01/01/21 288 lb (130.6 kg)  09/26/20 292 lb (132.5 kg)    General: Well nourished, well developed, in no acute distress Head: Atraumatic, normal size   Eyes: PEERLA, EOMI  Neck: Supple, no JVD Endocrine: No thryomegaly Cardiac: Normal S1, S2; RRR; no murmurs, rubs, or gallops Lungs: Clear to auscultation bilaterally, no wheezing, rhonchi or rales  Abd: Soft, nontender, no hepatomegaly  Ext: No edema, pulses 2+ Musculoskeletal: No deformities, BUE and BLE strength normal and equal Skin: Warm and dry, no rashes   Neuro: Alert and oriented to person, place, time, and situation, CNII-XII grossly intact, no focal deficits  Psych: Normal mood and affect   ASSESSMENT:   Brandi Meyers is a 46 y.o. female who presents for the following: No diagnosis found.  PLAN:   There are no diagnoses linked to this encounter.  {Are you ordering a CV Procedure (e.g. stress test, cath, DCCV, TEE, etc)?   Press F2        :[id number]}  Disposition: No follow-ups on file.  Medication Adjustments/Labs and Tests Ordered: Current medicines are reviewed at length with the patient today.  Concerns regarding medicines are outlined above.  No orders of the defined types were placed in this encounter.  No orders of the defined types were placed in this encounter.   There are no Patient Instructions on file for this visit.   Time Spent with Patient: I have spent a total of *** minutes with patient reviewing hospital notes, telemetry, EKGs, labs and examining the patient as well as establishing an assessment and plan that was discussed with the patient.  > 50% of time was spent in direct patient care.  Signed, Addison Naegeli. Audie Box, MD, [place]  [address], [place], [place] [address] [phone]  04/17/2022 1:35 PM

## 2022-04-18 ENCOUNTER — Ambulatory Visit: Payer: BC Managed Care – PPO | Admitting: Cardiovascular Disease

## 2022-04-25 NOTE — Progress Notes (Signed)
Cardiology Office Note:   Date:  04/26/2022  NAME:  Brandi Meyers    MRN: [id number] DOB:  Feb 20, 1976   PCP:  Rory Percy, MD  Cardiologist:  Evalina Field, MD  Electrophysiologist:  None   Referring MD: Rory Percy, MD   Chief Complaint  Patient presents with   Follow-up   History of Present Illness:   Brandi Meyers is a 46 y.o. female with a hx of paroxysmal atrial fibrillation, hyperlipidemia, coronary calcium score of 2, sleep apnea who presents for follow-up.  She has not completed her coronary CTA.  Still an occasional twinge in her chest.  Given her ongoing tobacco abuse of 1/2 pack/day I have recommended to proceed with coronary CTA.  She has not taken her statin either.  She has reservation about taking this.  Apparently she is concerned about possible side effects.  I discussed with her that she may not experience side effects.  She does have severe sleep apnea.  Apparently she missed her CPAP in lab titration appointment.  She tells me she never got a notification of the appointment.  Blood pressure is elevated today.  Had an A-fib episode in March.  No palpitations.  She may experience fluttering in her chest 1-2 times per month.  Overall seems to be doing well.  Denies any significant chest pain or trouble breathing.  Needs to quit smoking.  Would really be good to get on a cholesterol-lowering medication.  Problem List 1. Afib  -pAF captured on Kardia mobile  -CHADSVASC=1 (woman) 2. SVT -EAT vs AVNRT 3. HLD -Total cholesterol 179, HDL 30, LDL 126, triglycerides 116 4. Tobacco abuse  5. Obesity  -BMI 41 6. Coronary calcium  -score 2.1 (92nd percentile) 7. OSA -severe sleep apnea  -BMI 44  Past Medical History: Past Medical History:  Diagnosis Date   Spinal cord tumor     Past Surgical History: Past Surgical History:  Procedure Laterality Date   CHOLECYSTECTOMY     KNEE SURGERY     spinal tumor surgery      Current Medications: Current Meds  Medication  Sig   albuterol (VENTOLIN HFA) 108 (90 Base) MCG/ACT inhaler TAKE 2 PUFFS BY MOUTH 4 TIMES A DAY   b complex vitamins capsule Take by mouth.   Cholecalciferol 25 MCG (1000 UT) tablet Take by mouth.   DULoxetine (CYMBALTA) 20 MG capsule Take 20 mg by mouth daily.   ibuprofen (ADVIL) 100 MG/5ML suspension Take 200 mg by mouth every 4 (four) hours as needed.   metFORMIN (GLUCOPHAGE) 1000 MG tablet Take 1,000 mg by mouth 2 (two) times daily with a meal.   metFORMIN (GLUCOPHAGE-XR) 750 MG 24 hr tablet Take 2 tablets by mouth every morning.   metoprolol succinate (TOPROL-XL) 25 MG 24 hr tablet TAKE 1 TABLET (25 MG TOTAL) BY MOUTH DAILY.   metoprolol tartrate (LOPRESSOR) 100 MG tablet TAKE 1 TABLET 2 HR PRIOR TO CARDIAC PROCEDURE   Multiple Vitamin (MULTIVITAMIN WITH MINERALS) TABS tablet Take 1 tablet by mouth daily.   rosuvastatin (CRESTOR) 20 MG tablet Take 1 tablet (20 mg total) by mouth daily.   triamcinolone (KENALOG) 0.1 % APPLY TO AFFECTED AREA TWICE A DAY   Zinc 50 MG TABS Take by mouth.     Allergies:    Patient has no known allergies.   Social History: Social History   Socioeconomic History   Marital status: Married    Spouse name: Not on file   Number of children: 0  Years of education: Not on file   Highest education level: Not on file  Occupational History   Occupation: teahcer's assistannt  Tobacco Use   Smoking status: Every Day    Packs/day: 1.00    Years: 25.00    Total pack years: 25.00    Types: Cigarettes   Smokeless tobacco: Never  Substance and Sexual Activity   Alcohol use: Never   Drug use: Never   Sexual activity: Not on file  Other Topics Concern   Not on file  Social History Narrative   Not on file   Social Determinants of Health   Financial Resource Strain: Not on file  Food Insecurity: Not on file  Transportation Needs: Not on file  Physical Activity: Not on file  Stress: Not on file  Social Connections: Not on file     Family  History: The patient's family history includes Arrhythmia in her mother; Heart attack in her maternal grandmother; Heart failure in her mother; Stroke in her father.  ROS:   All other ROS reviewed and negative. Pertinent positives noted in the HPI.     EKGs/Labs/Other Studies Reviewed:   The following studies were personally reviewed by me today:   TTE 01/25/2021  1. Left ventricular ejection fraction, by estimation, is 60 to 65%. The  left ventricle has normal function. The left ventricle has no regional  wall motion abnormalities. Left ventricular diastolic parameters were  normal.   2. Right ventricular systolic function is normal. The right ventricular  size is normal. Tricuspid regurgitation signal is inadequate for assessing  PA pressure.   3. The mitral valve is grossly normal. Trivial mitral valve  regurgitation. No evidence of mitral stenosis.   4. The aortic valve is grossly normal. Aortic valve regurgitation is not  visualized. No aortic stenosis is present.   5. The inferior vena cava is normal in size with greater than 50%  respiratory variability, suggesting right atrial pressure of 3 mmHg.   Recent Labs: 08/17/2021: BUN 6; Creatinine, Ser 0.62; Potassium 4.5; Sodium 142   Recent Lipid Panel    Component Value Date/Time   CHOL 170 08/17/2021 1645   TRIG 152 (H) 08/17/2021 1645   HDL 30 (L) 08/17/2021 1645   CHOLHDL 5.7 (H) 08/17/2021 1645   LDLCALC 113 (H) 08/17/2021 1645    Physical Exam:   VS:  BP (!) 148/98   Pulse 89   Ht 5' 9.5" (1.765 m)   Wt (!) 301 lb 12.8 oz (136.9 kg)   SpO2 97%   BMI 43.93 kg/m    Wt Readings from Last 3 Encounters:  04/26/22 (!) 301 lb 12.8 oz (136.9 kg)  08/17/21 295 lb 6.4 oz (134 kg)  01/01/21 288 lb (130.6 kg)    General: Well nourished, well developed, in no acute distress Head: Atraumatic, normal size  Eyes: PEERLA, EOMI  Neck: Supple, no JVD Endocrine: No thryomegaly Cardiac: Normal S1, S2; RRR; no murmurs, rubs,  or gallops Lungs: Clear to auscultation bilaterally, no wheezing, rhonchi or rales  Abd: Soft, nontender, no hepatomegaly  Ext: No edema, pulses 2+ Musculoskeletal: No deformities, BUE and BLE strength normal and equal Skin: Warm and dry, no rashes   Neuro: Alert and oriented to person, place, time, and situation, CNII-XII grossly intact, no focal deficits  Psych: Normal mood and affect   ASSESSMENT:   SHAHRZAD KOBLE is a 46 y.o. female who presents for the following: 1. Chest pain of uncertain etiology   2. Agatston coronary  artery calcium score less than 100   3. Paroxysmal atrial fibrillation (HCC)   4. Mixed hyperlipidemia   5. SVT (supraventricular tachycardia) ([place])   6. Tobacco abuse   7. OSA (obstructive sleep apnea)   8. Obesity, morbid, BMI 40.0-49.9 ([place])   9. Precordial pain   10. Pre-procedure lab exam     PLAN:   1. Chest pain of uncertain etiology 2. Agatston coronary artery calcium score less than 100 -Still with occasional chest discomfort.  Coronary calcium score is abnormal.  She is still smoking.  We will proceed with coronary CTA.  100 mg of metoprolol tartrate 2 hours before the scan.  BMP today.  3. Paroxysmal atrial fibrillation (HCC) -Still with A-fib episodes.  Occurring 1-2 times per month.  Brief.  Last seconds.  Last episode in March.  Has sleep apnea which is untreated.  I believe this would be the best way to start.  I believe her A-fib is likely caused from this.  See discussion below.  CHA2DS2-VASc equals 1 (female).  No need for anticoagulation at this time.  4. Mixed hyperlipidemia -Cholesterol slightly elevated.  Coronary calcium score of 2.  Given her age would recommend statin.  She will think about this.  5. SVT (supraventricular tachycardia) (HCC) -We will consistent with A-fib.  6. Tobacco abuse -Half pack per day.  3 minutes of smoking cessation counseling was provided.  7. OSA (obstructive sleep apnea) -Diagnosed with severe sleep  apnea.  She was never informed of her in lab CPAP titration appointment.  This was documented as a missed appointment however this is not the case.  She still has ongoing symptoms of snoring and fatigue.  We will reach back out to sleep medicine to see if we can get her sleep study performed.  8. Obesity, morbid, BMI 40.0-49.9 ([place]) -Weight loss recommended.     Disposition: Return in about 1 year (around 04/27/2023).  Medication Adjustments/Labs and Tests Ordered: Current medicines are reviewed at length with the patient today.  Concerns regarding medicines are outlined above.  Orders Placed This Encounter  Procedures   CT CORONARY MORPH W/CTA COR W/SCORE W/CA W/CM &/OR WO/CM   Basic metabolic panel   Meds ordered this encounter  Medications   metoprolol tartrate (LOPRESSOR) 100 MG tablet    Sig: TAKE 1 TABLET 2 HR PRIOR TO CARDIAC PROCEDURE    Dispense:  1 tablet    Refill:  0    Patient Instructions  Medication Instructions:  Your Physician recommend you continue on your current medication as directed.    *If you need a refill on your cardiac medications before your next appointment, please call your pharmacy*   Lab Work: Your physician recommends lab work today (BMP)  If you have labs (blood work) drawn today and your tests are completely normal, you will receive your results only by: MyChart Message (if you have MyChart) OR A paper copy in the mail If you have any lab test that is abnormal or we need to change your treatment, we will call you to review the results.   Testing/Procedures: Cardiac CT Angiography (CTA), is a special type of CT scan that uses a computer to produce multi-dimensional views of major blood vessels throughout the body. In CT angiography, a contrast material is injected through an IV to help visualize the blood vessels [facility]    Follow-Up: At [facility], you and your health needs are our priority.  As part of our continuing  mission  to provide you with exceptional heart care, we have created designated Provider Care Teams.  These Care Teams include your primary Cardiologist (physician) and Advanced Practice Providers (APPs -  Physician Assistants and Nurse Practitioners) who all work together to provide you with the care you need, when you need it.  We recommend signing up for the patient portal called "MyChart".  Sign up information is provided on this After Visit Summary.  MyChart is used to connect with patients for Virtual Visits (Telemedicine).  Patients are able to view lab/test results, encounter notes, upcoming appointments, etc.  Non-urgent messages can be sent to your provider as well.   To learn more about what you can do with MyChart, go to [email or web address].    Your next appointment:   1 year(s)  The format for your next appointment:   In Person  Provider:   Evalina Field, MD    Your cardiac CT will be scheduled at one of the below locations:   [facility] [address] [place], [place] [address] [phone]  If scheduled at [facility], please arrive at the [facility] and Children's Entrance (Entrance C2) of [facility] 30 minutes prior to test start time. You can use the FREE valet parking offered at entrance C (encouraged to control the heart rate for the test)  Proceed to the [facility] Radiology Department (first floor) to check-in and test prep.  All radiology patients and guests should use entrance C2 at [facility], accessed from [facility], even though the hospital's physical address listed is [address]    If scheduled at [facility], please arrive 15 mins early for check-in and test prep.  Please follow these instructions carefully (unless otherwise directed):   On the Night Before the Test: Be sure to Drink plenty of water. Do not consume any caffeinated/decaffeinated beverages  or chocolate 12 hours prior to your test. Do not take any antihistamines 12 hours prior to your test.   On the Day of the Test: Drink plenty of water until 1 hour prior to the test. Do not eat any food 4 hours prior to the test. You may take your regular medications prior to the test.  Take metoprolol (Lopressor) 100 mg two hours prior to test. FEMALES- please wear underwire-free bra if available, avoid dresses & tight clothing       After the Test: Drink plenty of water. After receiving IV contrast, you may experience a mild flushed feeling. This is normal. On occasion, you may experience a mild rash up to 24 hours after the test. This is not dangerous. If this occurs, you can take Benadryl 25 mg and increase your fluid intake. If you experience trouble breathing, this can be serious. If it is severe call 911 IMMEDIATELY. If it is mild, please call our office. If you take any of these medications: Glipizide/Metformin, Avandament, Glucavance, please do not take 48 hours after completing test unless otherwise instructed.  We will call to schedule your test 2-4 weeks out understanding that some insurance companies will need an authorization prior to the service being performed.   For non-scheduling related questions, please contact the cardiac imaging nurse navigator should you have any questions/concerns: Marchia Bond, Cardiac Imaging Nurse Navigator Gordy Clement, Cardiac Imaging Nurse Navigator [facility] Direct Office Dial: [phone]   For scheduling needs, including cancellations and rescheduling, please call [place], [phone].  {  Time Spent with Patient: I have spent a total of 35 minutes with patient reviewing hospital notes, telemetry, EKGs, labs and examining the patient as well as establishing an assessment and plan that was discussed with the patient.  > 50% of time was spent in direct patient care.  Signed, Addison Naegeli.  Audie Box, MD, [place]  [address], [place], [place] [address] [phone]  04/26/2022 10:21 AM

## 2022-04-26 ENCOUNTER — Telehealth: Payer: Self-pay | Admitting: *Deleted

## 2022-04-26 ENCOUNTER — Ambulatory Visit: Payer: BC Managed Care – PPO | Admitting: Cardiovascular Disease

## 2022-04-26 ENCOUNTER — Encounter: Payer: Self-pay | Admitting: Cardiovascular Disease

## 2022-04-26 VITALS — BP 148/98 | HR 89 | Ht 69.5 in | Wt 301.8 lb

## 2022-04-26 DIAGNOSIS — R931 Abnormal findings on diagnostic imaging of heart and coronary circulation: Secondary | ICD-10-CM

## 2022-04-26 DIAGNOSIS — R072 Precordial pain: Secondary | ICD-10-CM

## 2022-04-26 DIAGNOSIS — E782 Mixed hyperlipidemia: Secondary | ICD-10-CM

## 2022-04-26 DIAGNOSIS — R079 Chest pain, unspecified: Secondary | ICD-10-CM

## 2022-04-26 DIAGNOSIS — I480 Paroxysmal atrial fibrillation: Secondary | ICD-10-CM

## 2022-04-26 DIAGNOSIS — Z720 Tobacco use: Secondary | ICD-10-CM

## 2022-04-26 DIAGNOSIS — I471 Supraventricular tachycardia: Secondary | ICD-10-CM

## 2022-04-26 DIAGNOSIS — G4733 Obstructive sleep apnea (adult) (pediatric): Secondary | ICD-10-CM

## 2022-04-26 DIAGNOSIS — Z01812 Encounter for preprocedural laboratory examination: Secondary | ICD-10-CM

## 2022-04-26 MED ORDER — METOPROLOL TARTRATE 100 MG PO TABS: ORAL_TABLET | ORAL | 0 refills | Status: DC

## 2022-04-26 NOTE — Patient Instructions (Signed)
Medication Instructions:  Your Physician recommend you continue on your current medication as directed.    *If you need a refill on your cardiac medications before your next appointment, please call your pharmacy*   Lab Work: Your physician recommends lab work today (BMP)  If you have labs (blood work) drawn today and your tests are completely normal, you will receive your results only by: MyChart Message (if you have MyChart) OR A paper copy in the mail If you have any lab test that is abnormal or we need to change your treatment, we will call you to review the results.   Testing/Procedures: Cardiac CT Angiography (CTA), is a special type of CT scan that uses a computer to produce multi-dimensional views of major blood vessels throughout the body. In CT angiography, a contrast material is injected through an IV to help visualize the blood vessels [facility]    Follow-Up: At [facility], you and your health needs are our priority.  As part of our continuing mission to provide you with exceptional heart care, we have created designated Provider Care Teams.  These Care Teams include your primary Cardiologist (physician) and Advanced Practice Providers (APPs -  Physician Assistants and Nurse Practitioners) who all work together to provide you with the care you need, when you need it.  We recommend signing up for the patient portal called "MyChart".  Sign up information is provided on this After Visit Summary.  MyChart is used to connect with patients for Virtual Visits (Telemedicine).  Patients are able to view lab/test results, encounter notes, upcoming appointments, etc.  Non-urgent messages can be sent to your provider as well.   To learn more about what you can do with MyChart, go to [email or web address].    Your next appointment:   1 year(s)  The format for your next appointment:   In Person  Provider:   Evalina Field, MD    Your cardiac CT will be scheduled at  one of the below locations:   [facility] [address] [place], [place] [address] [phone]  If scheduled at [facility], please arrive at the [facility] and Children's Entrance (Entrance C2) of [facility] 30 minutes prior to test start time. You can use the FREE valet parking offered at entrance C (encouraged to control the heart rate for the test)  Proceed to the [facility] Radiology Department (first floor) to check-in and test prep.  All radiology patients and guests should use entrance C2 at [facility] accessed from [facility], even though the hospital's physical address listed is [address]    If scheduled at [facility], please arrive 15 mins early for check-in and test prep.  Please follow these instructions carefully (unless otherwise directed):   On the Night Before the Test: Be sure to Drink plenty of water. Do not consume any caffeinated/decaffeinated beverages or chocolate 12 hours prior to your test. Do not take any antihistamines 12 hours prior to your test.   On the Day of the Test: Drink plenty of water until 1 hour prior to the test. Do not eat any food 4 hours prior to the test. You may take your regular medications prior to the test.  Take metoprolol (Lopressor) 100 mg two hours prior to test. FEMALES- please wear underwire-free bra if available, avoid dresses & tight clothing       After the Test: Drink plenty of water. After receiving IV  contrast, you may experience a mild flushed feeling. This is normal. On occasion, you may experience a mild rash up to 24 hours after the test. This is not dangerous. If this occurs, you can take Benadryl 25 mg and increase your fluid intake. If you experience trouble breathing, this can be serious. If it is severe call 911 IMMEDIATELY. If it is mild, please call our office. If you take any of these medications:  Glipizide/Metformin, Avandament, Glucavance, please do not take 48 hours after completing test unless otherwise instructed.  We will call to schedule your test 2-4 weeks out understanding that some insurance companies will need an authorization prior to the service being performed.   For non-scheduling related questions, please contact the cardiac imaging nurse navigator should you have any questions/concerns: Marchia Bond, Cardiac Imaging Nurse Navigator Gordy Clement, Cardiac Imaging Nurse Navigator [place] [facility] Direct Office Dial: [phone]   For scheduling needs, including cancellations and rescheduling, please call [place], [phone].  {

## 2022-04-26 NOTE — Telephone Encounter (Signed)
APAP order faxed to [facility]. Patient missed her CPAP titration from last year. OSA on HST was severe. Per Dr Claiborne Billings via phone conversation order APAP with setting 7-20 CmH2O EPR-3.

## 2022-04-26 NOTE — Telephone Encounter (Signed)
Left message to return a call to discuss her sleep study and how we are going to proceed with getting her started on CPAP.

## 2022-04-27 LAB — BASIC METABOLIC PANEL
BUN/Creatinine Ratio: 13 (ref 9–23)
BUN: 7 mg/dL (ref 6–24)
CO2: 26 mmol/L (ref 20–29)
Calcium: 9.8 mg/dL (ref 8.7–10.2)
Chloride: 104 mmol/L (ref 96–106)
Creatinine, Ser: 0.56 mg/dL — ABNORMAL LOW (ref 0.57–1.00)
Glucose: 93 mg/dL (ref 70–99)
Potassium: 4.5 mmol/L (ref 3.5–5.2)
Sodium: 141 mmol/L (ref 134–144)
eGFR: 113 mL/min/{1.73_m2} (ref >59)

## 2022-06-01 ENCOUNTER — Encounter (HOSPITAL_COMMUNITY): Payer: Self-pay

## 2022-06-24 ENCOUNTER — Other Ambulatory Visit: Payer: Self-pay | Admitting: Cardiovascular Disease

## 2022-06-28 ENCOUNTER — Other Ambulatory Visit: Payer: Self-pay | Admitting: Cardiovascular Disease

## 2022-06-28 MED ORDER — METOPROLOL SUCCINATE ER 25 MG PO TB24: 25.0000 mg | ORAL_TABLET | Freq: Every day | ORAL | 3 refills | Status: DC

## 2022-06-28 MED ORDER — ROSUVASTATIN CALCIUM 20 MG PO TABS: 20.0000 mg | ORAL_TABLET | Freq: Every day | ORAL | 3 refills | Status: DC

## 2022-07-24 IMAGING — CT CT CARDIAC CORONARY ARTERY CALCIUM SCORE
3 series · 14 of 20 positions shown, 15 images · non-contrast
Comparison: 08/13/2015 chest radiograph from [HOSPITAL] [REDACTED].
COMPARISON: 08/13/2015 chest radiograph from [HOSPITAL] [REDACTED].

Addendum:
EXAM:
OVER-READ INTERPRETATION  CT CHEST

The following report is an over-read performed by radiologist Dr.
Nana Kwesi Bissah [REDACTED] on 01/25/2021. This over-read
does not include interpretation of cardiac or coronary anatomy or
pathology. The calcium score interpretation by the cardiologist is
attached.
TECHNIQUE: A gated, non-contrast computed tomography scan of the heart was
performed using 3mm slice thickness. Axial images were analyzed on a
dedicated workstation. Calcium scoring of the coronary arteries was
performed using the Agatston method.

[Series 2: casc 3.0 bv41 2 bestdiast 71 % · axial · 0.44mm/px · z∈[-224,-149]mm · 4 of 43 slices shown, 5 images]
[im 9/43  vessel]
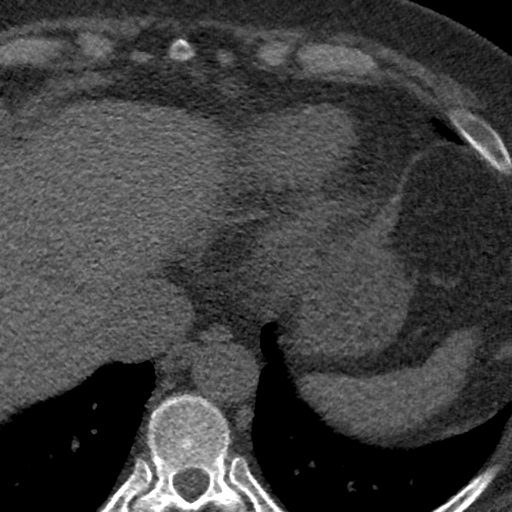
[im 9/43  lung]
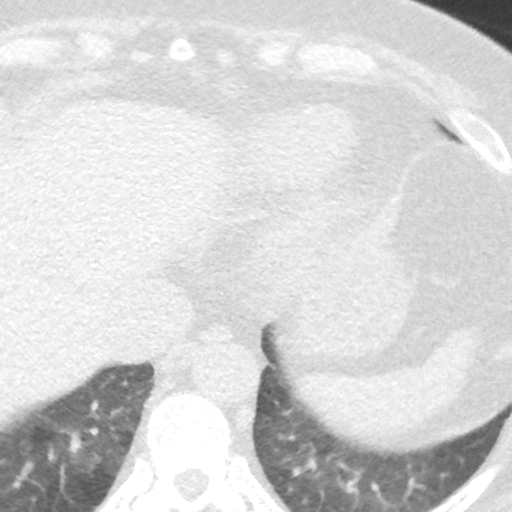
[im 17/43  vessel]
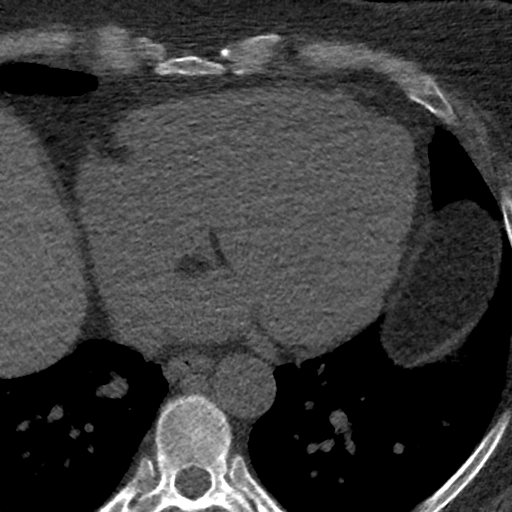
[im 26/43  vessel]
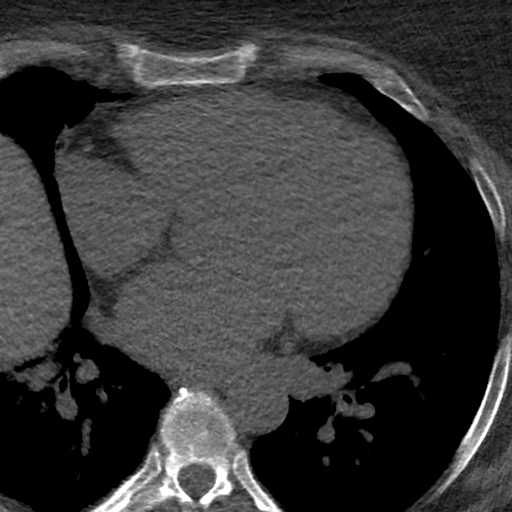
[im 34/43  vessel]
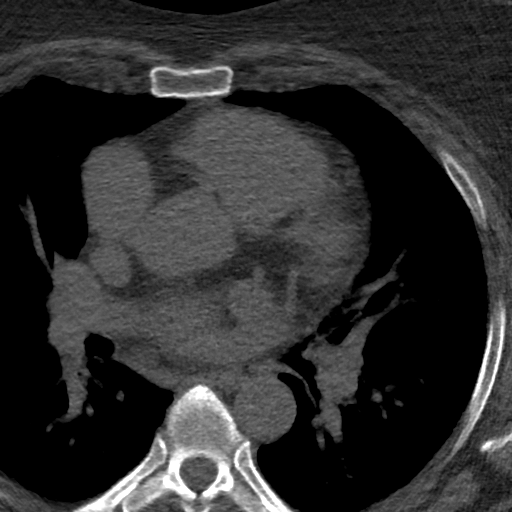

[Series 3: lung 71 % · axial · 0.71mm/px · z∈[-227,-143]mm · 5 of 43 slices shown]
[im 8/43  lung]
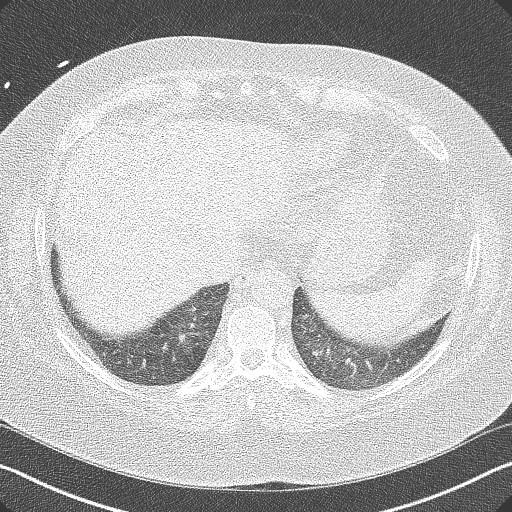
[im 15/43  lung]
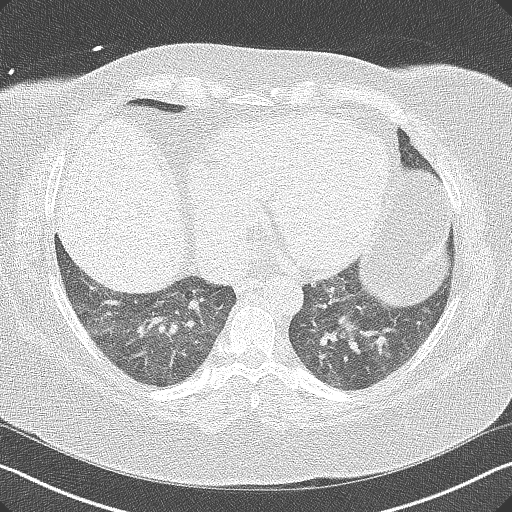
[im 22/43  lung]
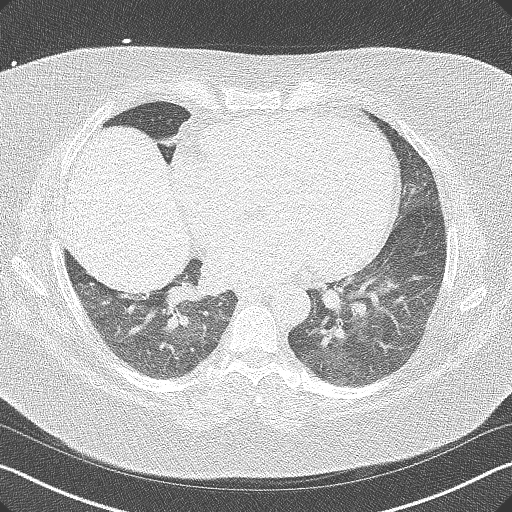
[im 29/43  lung]
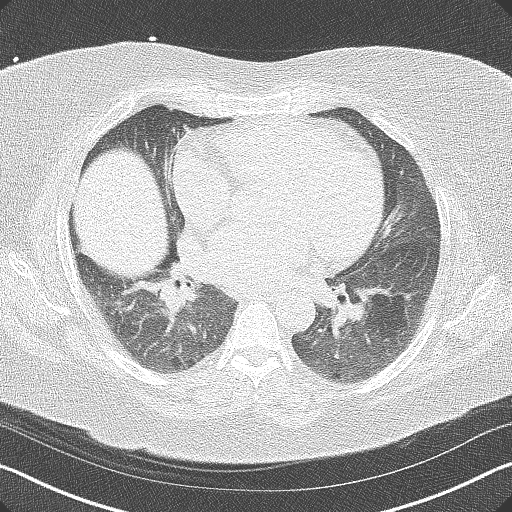
[im 36/43  lung]
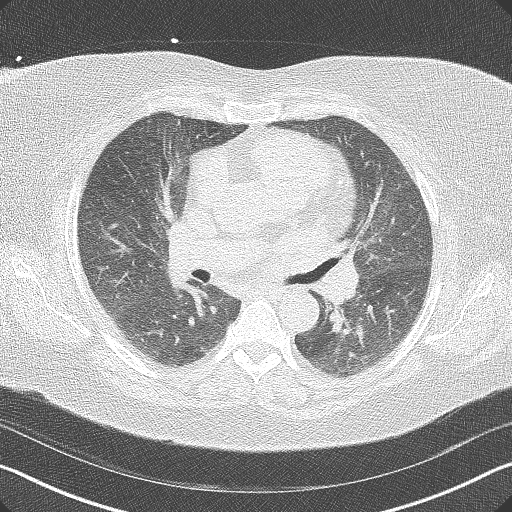

[Series 4: lung st 71 % · axial · 0.71mm/px · z∈[-227,-143]mm · 5 of 43 slices shown]
[im 8/43  lung]
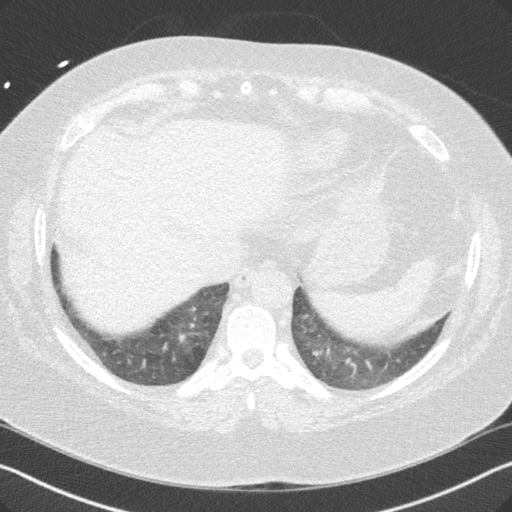
[im 15/43  lung]
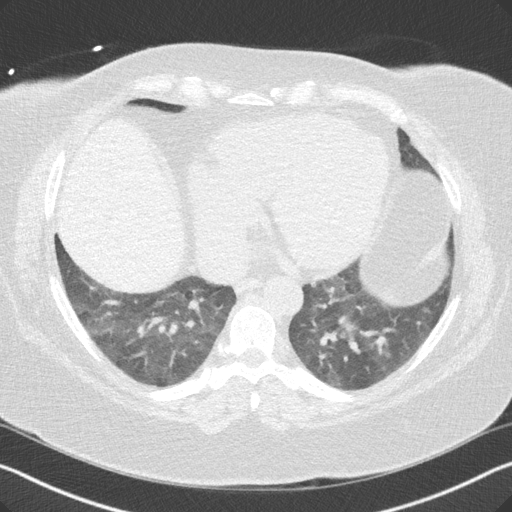
[im 22/43  lung]
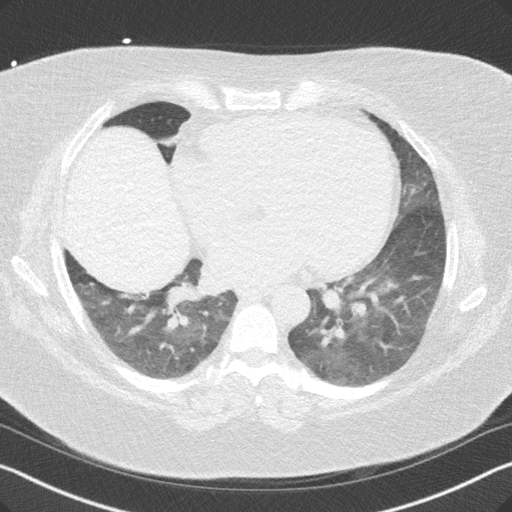
[im 29/43  lung]
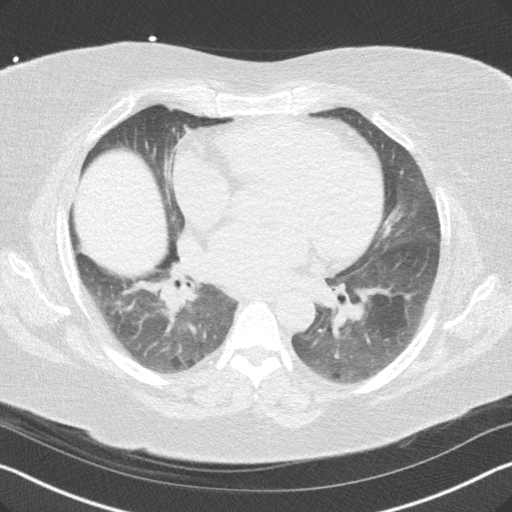
[im 36/43  lung]
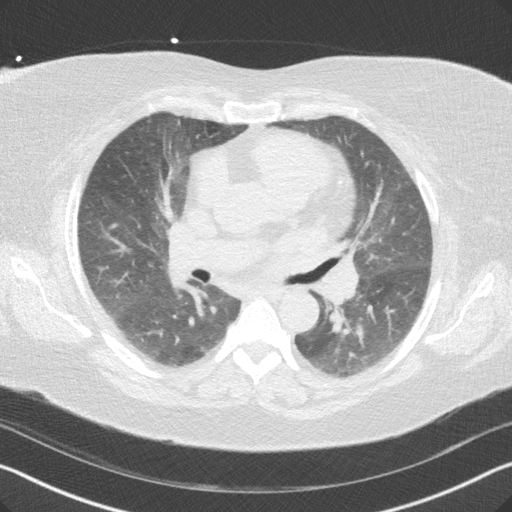

[14 of 20 positions shown; findings below may reference images not displayed]

FINDINGS: Vascular: Normal aortic caliber.

Mediastinum/Nodes: No imaged thoracic adenopathy.

Lungs/Pleura: No pleural fluid. Moderate right hemidiaphragm
eventration anteriorly.

5 mm left lower lobe pulmonary nodule on [DATE].

Upper Abdomen: Normal imaged portions of the liver, spleen, stomach.

Musculoskeletal: No acute osseous abnormality.
IMPRESSION: 1. No acute findings in the imaged extracardiac chest.
2. 5 mm left lower lobe pulmonary nodule. No follow-up needed if
patient is low-risk. Non-contrast chest CT can be considered in 12
months if patient is high-risk. This recommendation follows the
consensus statement: Guidelines for Management of Incidental
Pulmonary Nodules Detected on CT Images: From the [REDACTED]AL DATA:  Cardiovascular Disease Risk stratification

EXAM:
Coronary Calcium Score
FINDINGS: Coronary arteries: Normal origins.

Coronary Calcium Score:

Left main: 0

Left anterior descending artery: 0

Left circumflex artery: 0

Right coronary artery:

Total:

Percentile: 92nd

Pericardium: Normal.

Ascending Aorta: Normal caliber.

Non-cardiac: See separate report from [REDACTED].
IMPRESSION: Coronary calcium score of 2.12. This was 92nd percentile for age-,
race-, and sex-matched controls.



If CAC=0, it is reasonable to withhold statin therapy and reassess
in 5 to 10 years, as long as higher risk conditions are absent
(diabetes mellitus, family history of premature CHD in first degree
relatives (males <55 years; females <65 years), cigarette smoking,
or LDL >=190 mg/dL).

If CAC is 1 to 99, it is reasonable to initiate statin therapy for
patients >=55 years of age.

If CAC is >=100 or >=75th percentile, it is reasonable to initiate
statin therapy at any age.

Cardiology referral should be considered for patients with CAC
scores >=400 or >=75th percentile.

*3992 AHA/ACC/AACVPR/AAPA/ABC/[place]/EBINENG/VIANDY/Davisnew/MOATSHE/ARA/TIGER
Guideline on the Management of Blood Cholesterol: A Report of the
American College of Cardiology/American Heart Association Task Force
on Clinical Practice Guidelines. J Am Coll Cardiol.
5520;73(24):6330-6256.

*** End of Addendum ***
EXAM:
OVER-READ INTERPRETATION  CT CHEST

The following report is an over-read performed by radiologist Dr.
Nana Kwesi Bissah [REDACTED] on 01/25/2021. This over-read
does not include interpretation of cardiac or coronary anatomy or
pathology. The calcium score interpretation by the cardiologist is
attached.
FINDINGS: Vascular: Normal aortic caliber.

Mediastinum/Nodes: No imaged thoracic adenopathy.

Lungs/Pleura: No pleural fluid. Moderate right hemidiaphragm
eventration anteriorly.

5 mm left lower lobe pulmonary nodule on [DATE].

Upper Abdomen: Normal imaged portions of the liver, spleen, stomach.

Musculoskeletal: No acute osseous abnormality.
IMPRESSION: 1. No acute findings in the imaged extracardiac chest.
2. 5 mm left lower lobe pulmonary nodule. No follow-up needed if
patient is low-risk. Non-contrast chest CT can be considered in 12
months if patient is high-risk. This recommendation follows the
consensus statement: Guidelines for Management of Incidental
Pulmonary Nodules Detected on CT Images: From the [HOSPITAL]

## 2023-04-08 ENCOUNTER — Other Ambulatory Visit: Payer: Self-pay | Admitting: Cardiovascular Disease

## 2023-05-02 ENCOUNTER — Other Ambulatory Visit: Payer: Self-pay | Admitting: Cardiovascular Disease

## 2023-05-09 DIAGNOSIS — S62336G Displaced fracture of neck of fifth metacarpal bone, right hand, subsequent encounter for fracture with delayed healing: Secondary | ICD-10-CM | POA: Insufficient documentation

## 2023-05-13 ENCOUNTER — Other Ambulatory Visit: Payer: Self-pay | Admitting: Cardiovascular Disease

## 2023-06-04 ENCOUNTER — Other Ambulatory Visit: Payer: Self-pay | Admitting: Cardiovascular Disease

## 2023-07-06 ENCOUNTER — Other Ambulatory Visit: Payer: Self-pay | Admitting: Cardiovascular Disease

## 2023-08-03 ENCOUNTER — Other Ambulatory Visit: Payer: Self-pay | Admitting: Cardiovascular Disease

## 2024-04-26 ENCOUNTER — Encounter (INDEPENDENT_AMBULATORY_CARE_PROVIDER_SITE_OTHER): Payer: Self-pay | Admitting: *Deleted

## 2024-04-28 DIAGNOSIS — I471 Supraventricular tachycardia, unspecified: Secondary | ICD-10-CM | POA: Insufficient documentation

## 2024-04-28 DIAGNOSIS — F17200 Nicotine dependence, unspecified, uncomplicated: Secondary | ICD-10-CM | POA: Insufficient documentation

## 2024-04-28 DIAGNOSIS — F341 Dysthymic disorder: Secondary | ICD-10-CM | POA: Insufficient documentation

## 2024-04-28 DIAGNOSIS — N3946 Mixed incontinence: Secondary | ICD-10-CM | POA: Insufficient documentation

## 2024-04-29 ENCOUNTER — Encounter (INDEPENDENT_AMBULATORY_CARE_PROVIDER_SITE_OTHER): Payer: Self-pay | Admitting: *Deleted

## 2024-05-10 ENCOUNTER — Telehealth (INDEPENDENT_AMBULATORY_CARE_PROVIDER_SITE_OTHER): Payer: Self-pay | Admitting: *Deleted

## 2024-05-10 ENCOUNTER — Ambulatory Visit (INDEPENDENT_AMBULATORY_CARE_PROVIDER_SITE_OTHER): Admitting: Gastroenterology

## 2024-05-10 ENCOUNTER — Encounter (INDEPENDENT_AMBULATORY_CARE_PROVIDER_SITE_OTHER): Payer: Self-pay | Admitting: Gastroenterology

## 2024-05-10 ENCOUNTER — Other Ambulatory Visit: Payer: Self-pay | Admitting: *Deleted

## 2024-05-10 ENCOUNTER — Encounter: Payer: Self-pay | Admitting: *Deleted

## 2024-05-10 VITALS — BP 116/74 | HR 71 | Temp 98.1°F | Ht 70.0 in | Wt 303.2 lb

## 2024-05-10 DIAGNOSIS — D509 Iron deficiency anemia, unspecified: Secondary | ICD-10-CM | POA: Diagnosis not present

## 2024-05-10 DIAGNOSIS — Z6841 Body Mass Index (BMI) 40.0 and over, adult: Secondary | ICD-10-CM | POA: Insufficient documentation

## 2024-05-10 DIAGNOSIS — Z8719 Personal history of other diseases of the digestive system: Secondary | ICD-10-CM | POA: Diagnosis not present

## 2024-05-10 DIAGNOSIS — D500 Iron deficiency anemia secondary to blood loss (chronic): Secondary | ICD-10-CM | POA: Insufficient documentation

## 2024-05-10 MED ORDER — PEG 3350-KCL-NA BICARB-NACL 420 G PO SOLR: 4000.0000 mL | Freq: Once | ORAL | 0 refills | Status: AC

## 2024-05-10 NOTE — H&P (View-Only) (Signed)
 Carole Deere Faizan Billiejean Schimek , M.D. Gastroenterology & Hepatology [facility] [address] [place], [place] [address] Primary Care Physician: [name], [name] [facility] [address] [place] [address]  Chief Complaint: Iron deficiency anemia, history of ?UC  History of Present Illness: Brandi Meyers is a 48 y.o. female proximal A-fib bilateral evaluation: PCOS on metformin, history of spinal cord tumor s/p radiation, cholecystectomy who presents for evaluation of deficiency anemia with remote history of ulcerative colitis  Patient reports back in 2006 she had a colonoscopy done by Dr. Shaaron and was told she had ulcerative colitis.  She was started on Asacol for couple months and hence she stopped since her symptoms improved  Currently patient has Bristol stool scale bowel movement type IV every other day.  Denies any hematochezia Patient denies Donating frequent blood and does eat.Patient has typical symptoms of iron deficiency with PICA (ice craving) And fatigue  Patient menstruation are irregular with low volume since her back radiation many years The patient denies having any nausea, vomiting, fever, chills, hematochezia, melena, hematemesis, abdominal distention, abdominal pain, diarrhea, jaundice, pruritus or weight loss.  Labs from 02/2024 hemoglobin 10.9 MCV 63 normal liver enzymes hemoglobin A1c 6.10  Ferritin: 8  Last ZHI:wnwz Last Colonoscopy:2006  FHx: neg for any gastrointestinal/liver disease, no malignancies Social: neg smoking, alcohol or illicit drug use Surgical: Cholecystectomy  Past Medical History: Past Medical History:  Diagnosis Date   Spinal cord tumor     Past Surgical History: Past Surgical History:  Procedure Laterality Date   CHOLECYSTECTOMY     KNEE SURGERY     spinal tumor surgery      Family History: Family History  Problem Relation Age of Onset   Heart failure Mother    Arrhythmia Mother     Stroke Father    Heart attack Maternal Grandmother     Social History: Social History   Tobacco Use  Smoking Status Every Day   Current packs/day: 1.00   Average packs/day: 1 pack/day for 25.0 years (25.0 ttl pk-yrs)   Types: Cigarettes  Smokeless Tobacco Never   Social History   Substance and Sexual Activity  Alcohol Use Never   Social History   Substance and Sexual Activity  Drug Use Never    Allergies: Allergies  Allergen Reactions   Latex Dermatitis    Medications: Current Outpatient Medications  Medication Sig Dispense Refill   albuterol (VENTOLIN HFA) 108 (90 Base) MCG/ACT inhaler TAKE 2 PUFFS BY MOUTH 4 TIMES A DAY     b complex vitamins capsule Take by mouth.     Cholecalciferol 25 MCG (1000 UT) tablet Take by mouth.     clotrimazole-betamethasone (LOTRISONE) cream as needed.     DULoxetine (CYMBALTA) 30 MG capsule TAKE 1 CAPSULE BY MOUTH EVERY DAY WITH 60MG  DOSE     DULoxetine (CYMBALTA) 60 MG capsule Take 60 mg by mouth every morning.     ibuprofen (ADVIL) 100 MG/5ML suspension Take 200 mg by mouth every 4 (four) hours as needed. (Patient taking differently: Take 200 mg by mouth daily.)     metFORMIN (GLUCOPHAGE-XR) 750 MG 24 hr tablet Take 2 tablets by mouth every morning. (Patient taking differently: Take 2 tablets by mouth every morning.)     metoprolol  succinate (TOPROL -XL) 25 MG 24 hr tablet TAKE 1 TABLET (25 MG TOTAL) BY MOUTH DAILY. 90 tablet 0   Multiple Vitamin (MULTIVITAMIN WITH MINERALS) TABS tablet Take 1 tablet by  mouth daily.     rosuvastatin  (CRESTOR ) 20 MG tablet Take 1 tablet (20 mg total) by mouth daily. NEED OV. (Patient taking differently: Take 20 mg by mouth in the morning and at bedtime. NEED OV.) 90 tablet 0   triamcinolone (KENALOG) 0.1 % APPLY TO AFFECTED AREA TWICE A DAY     Zinc 50 MG TABS Take by mouth.     No current facility-administered medications for this visit.    Review of Systems: GENERAL: negative for malaise, night  sweats HEENT: No changes in hearing or vision, no nose bleeds or other nasal problems. NECK: Negative for lumps, goiter, pain and significant neck swelling RESPIRATORY: Negative for cough, wheezing CARDIOVASCULAR: Negative for chest pain, leg swelling, palpitations, orthopnea GI: SEE HPI MUSCULOSKELETAL: Negative for joint pain or swelling, back pain, and muscle pain. SKIN: Negative for lesions, rash HEMATOLOGY Negative for prolonged bleeding, bruising easily, and swollen nodes. ENDOCRINE: Negative for cold or heat intolerance, polyuria, polydipsia and goiter. NEURO: negative for tremor, gait imbalance, syncope and seizures. The remainder of the review of systems is noncontributory.   Physical Exam: BP 116/74   Pulse 71   Temp 98.1 F (36.7 C)   Ht 5' 10 (1.778 m)   Wt (!) 303 lb 3.2 oz (137.5 kg)   LMP  (LMP Unknown)   BMI 43.50 kg/m  GENERAL: The patient is AO x3, in no acute distress. HEENT: Head is normocephalic and atraumatic. EOMI are intact. Mouth is well hydrated and without lesions. NECK: Supple. No masses LUNGS: Clear to auscultation. No presence of rhonchi/wheezing/rales. Adequate chest expansion HEART: RRR, normal s1 and s2. ABDOMEN: Soft, nontender, no guarding, no peritoneal signs, and nondistended. BS +. No masses.  Imaging/Labs: as above      No data to display         No results found for: IRON, TIBC, FERRITIN  I personally reviewed and interpreted the available labs, imaging and endoscopic files.  Impression and Plan:  Brandi Meyers is a 48 y.o. female proximal A-fib bilateral evaluation: PCOS on metformin, history of spinal cord tumor s/p radiation, cholecystectomy who presents for evaluation of deficiency anemia with remote history of ulcerative colitis  # Iron deficiency anemia # Remote history of?  Ulcerative colitis  Patient has microcytic anemia with ferritin 8, typical symptoms of Pica consistent with iron deficiency anemia  No overt  GI bleed, no menorrhagia reported, patient is not vegan does not donate blood often  Reports history of ulcerative colitis in 2006 where she was treated with Asacol and no follow-up since then  As per ACG guidelines , bidirectional endoscopy is recommended over iron replacement therapy only. We will proceed along with Upper endoscopy with small bowel biopsies and Colonoscopy   P.o. iron supplementation every 48 hours after the procedure Given severity of iron deficiency and symptomatic anemia will also benefit from hematology evaluation for iron infusion  #BMI 43       - walking at a brisk pace/biking at moderate intesity 2.5-5 hours per week     - use pedometer/step counter to track activity     - goal to lose 5-10% of initial body weight     - avoid suagry drinks and juices, use zero calorie beverages     - increase water intake     - eat a low carb diet with plenty of veggies and fruit     - Get sufficient sleep 7-8 hrs nightly     - maitain active lifestyle     -  avoid alcohol     - recommend 2-3 cups Coffee daily     - Counsel on lowering cholesterol by having a diet rich in vegetables,          protein (avoid red meats) and good fats(fish, salmon).        All questions were answered.      Manpreet Kemmer Faizan Ozzy Bohlken, MD Gastroenterology and Hepatology [facility]   This chart has been completed using [facility] Dictation software, and while attempts have been made to ensure accuracy , certain words and phrases may not be transcribed as intended

## 2024-05-10 NOTE — Progress Notes (Signed)
 Carole Deere Faizan Billiejean Schimek , M.D. Gastroenterology & Hepatology [facility] [address] [place], [place] [address] Primary Care Physician: [name], [name] [facility] [address] [place] [address]  Chief Complaint: Iron deficiency anemia, history of ?UC  History of Present Illness: Brandi Meyers is a 48 y.o. female proximal A-fib bilateral evaluation: PCOS on metformin, history of spinal cord tumor s/p radiation, cholecystectomy who presents for evaluation of deficiency anemia with remote history of ulcerative colitis  Patient reports back in 2006 she had a colonoscopy done by Dr. Shaaron and was told she had ulcerative colitis.  She was started on Asacol for couple months and hence she stopped since her symptoms improved  Currently patient has Bristol stool scale bowel movement type IV every other day.  Denies any hematochezia Patient denies Donating frequent blood and does eat.Patient has typical symptoms of iron deficiency with PICA (ice craving) And fatigue  Patient menstruation are irregular with low volume since her back radiation many years The patient denies having any nausea, vomiting, fever, chills, hematochezia, melena, hematemesis, abdominal distention, abdominal pain, diarrhea, jaundice, pruritus or weight loss.  Labs from 02/2024 hemoglobin 10.9 MCV 63 normal liver enzymes hemoglobin A1c 6.10  Ferritin: 8  Last ZHI:wnwz Last Colonoscopy:2006  FHx: neg for any gastrointestinal/liver disease, no malignancies Social: neg smoking, alcohol or illicit drug use Surgical: Cholecystectomy  Past Medical History: Past Medical History:  Diagnosis Date   Spinal cord tumor     Past Surgical History: Past Surgical History:  Procedure Laterality Date   CHOLECYSTECTOMY     KNEE SURGERY     spinal tumor surgery      Family History: Family History  Problem Relation Age of Onset   Heart failure Mother    Arrhythmia Mother     Stroke Father    Heart attack Maternal Grandmother     Social History: Social History   Tobacco Use  Smoking Status Every Day   Current packs/day: 1.00   Average packs/day: 1 pack/day for 25.0 years (25.0 ttl pk-yrs)   Types: Cigarettes  Smokeless Tobacco Never   Social History   Substance and Sexual Activity  Alcohol Use Never   Social History   Substance and Sexual Activity  Drug Use Never    Allergies: Allergies  Allergen Reactions   Latex Dermatitis    Medications: Current Outpatient Medications  Medication Sig Dispense Refill   albuterol (VENTOLIN HFA) 108 (90 Base) MCG/ACT inhaler TAKE 2 PUFFS BY MOUTH 4 TIMES A DAY     b complex vitamins capsule Take by mouth.     Cholecalciferol 25 MCG (1000 UT) tablet Take by mouth.     clotrimazole-betamethasone (LOTRISONE) cream as needed.     DULoxetine (CYMBALTA) 30 MG capsule TAKE 1 CAPSULE BY MOUTH EVERY DAY WITH 60MG  DOSE     DULoxetine (CYMBALTA) 60 MG capsule Take 60 mg by mouth every morning.     ibuprofen (ADVIL) 100 MG/5ML suspension Take 200 mg by mouth every 4 (four) hours as needed. (Patient taking differently: Take 200 mg by mouth daily.)     metFORMIN (GLUCOPHAGE-XR) 750 MG 24 hr tablet Take 2 tablets by mouth every morning. (Patient taking differently: Take 2 tablets by mouth every morning.)     metoprolol  succinate (TOPROL -XL) 25 MG 24 hr tablet TAKE 1 TABLET (25 MG TOTAL) BY MOUTH DAILY. 90 tablet 0   Multiple Vitamin (MULTIVITAMIN WITH MINERALS) TABS tablet Take 1 tablet by  mouth daily.     rosuvastatin  (CRESTOR ) 20 MG tablet Take 1 tablet (20 mg total) by mouth daily. NEED OV. (Patient taking differently: Take 20 mg by mouth in the morning and at bedtime. NEED OV.) 90 tablet 0   triamcinolone (KENALOG) 0.1 % APPLY TO AFFECTED AREA TWICE A DAY     Zinc 50 MG TABS Take by mouth.     No current facility-administered medications for this visit.    Review of Systems: GENERAL: negative for malaise, night  sweats HEENT: No changes in hearing or vision, no nose bleeds or other nasal problems. NECK: Negative for lumps, goiter, pain and significant neck swelling RESPIRATORY: Negative for cough, wheezing CARDIOVASCULAR: Negative for chest pain, leg swelling, palpitations, orthopnea GI: SEE HPI MUSCULOSKELETAL: Negative for joint pain or swelling, back pain, and muscle pain. SKIN: Negative for lesions, rash HEMATOLOGY Negative for prolonged bleeding, bruising easily, and swollen nodes. ENDOCRINE: Negative for cold or heat intolerance, polyuria, polydipsia and goiter. NEURO: negative for tremor, gait imbalance, syncope and seizures. The remainder of the review of systems is noncontributory.   Physical Exam: BP 116/74   Pulse 71   Temp 98.1 F (36.7 C)   Ht 5' 10 (1.778 m)   Wt (!) 303 lb 3.2 oz (137.5 kg)   LMP  (LMP Unknown)   BMI 43.50 kg/m  GENERAL: The patient is AO x3, in no acute distress. HEENT: Head is normocephalic and atraumatic. EOMI are intact. Mouth is well hydrated and without lesions. NECK: Supple. No masses LUNGS: Clear to auscultation. No presence of rhonchi/wheezing/rales. Adequate chest expansion HEART: RRR, normal s1 and s2. ABDOMEN: Soft, nontender, no guarding, no peritoneal signs, and nondistended. BS +. No masses.  Imaging/Labs: as above      No data to display         No results found for: IRON, TIBC, FERRITIN  I personally reviewed and interpreted the available labs, imaging and endoscopic files.  Impression and Plan:  Brandi Meyers is a 48 y.o. female proximal A-fib bilateral evaluation: PCOS on metformin, history of spinal cord tumor s/p radiation, cholecystectomy who presents for evaluation of deficiency anemia with remote history of ulcerative colitis  # Iron deficiency anemia # Remote history of?  Ulcerative colitis  Patient has microcytic anemia with ferritin 8, typical symptoms of Pica consistent with iron deficiency anemia  No overt  GI bleed, no menorrhagia reported, patient is not vegan does not donate blood often  Reports history of ulcerative colitis in 2006 where she was treated with Asacol and no follow-up since then  As per ACG guidelines , bidirectional endoscopy is recommended over iron replacement therapy only. We will proceed along with Upper endoscopy with small bowel biopsies and Colonoscopy   P.o. iron supplementation every 48 hours after the procedure Given severity of iron deficiency and symptomatic anemia will also benefit from hematology evaluation for iron infusion  #BMI 43       - walking at a brisk pace/biking at moderate intesity 2.5-5 hours per week     - use pedometer/step counter to track activity     - goal to lose 5-10% of initial body weight     - avoid suagry drinks and juices, use zero calorie beverages     - increase water intake     - eat a low carb diet with plenty of veggies and fruit     - Get sufficient sleep 7-8 hrs nightly     - maitain active lifestyle     -  avoid alcohol     - recommend 2-3 cups Coffee daily     - Counsel on lowering cholesterol by having a diet rich in vegetables,          protein (avoid red meats) and good fats(fish, salmon).        All questions were answered.      Manpreet Kemmer Faizan Ozzy Bohlken, MD Gastroenterology and Hepatology [facility]   This chart has been completed using [facility] Dictation software, and while attempts have been made to ensure accuracy , certain words and phrases may not be transcribed as intended

## 2024-05-10 NOTE — Telephone Encounter (Signed)
 LMOVM to call back to schedule TCS/EGD with Dr. Cinderella, any room

## 2024-05-10 NOTE — Patient Instructions (Signed)
 It was very nice to meet you today, as dicussed with will plan for the following :  1) EGD and Colonoscopy  2) See hematologist

## 2024-05-11 NOTE — Telephone Encounter (Signed)
 Pt has been scheduled for 06/03/24. Instructions mailed and prep sent to the pharmacy.   Availity PA: [id number]  Quantity 1 Days  Procedure From - To Date 2024-05-11  Status NO AUTH REQUIRED  Message PRECERT IS NOT REQUIRED OR ONLY REQUIRED IN UNIQUE CIRCUMSTANCES FOR MEDICAID REFER TO PRIOR AUTH TOOL ON [email or web address] FOR ALL OTHERS REFER TO CODE SEARCH TOOL ON [email or web address] COVERAGE OF SERVICES ARE SUBJECT TO BENEFITS AND ELIGIBILITY  Procedure Code 2 56764  Quantity 1 Days  Procedure From - To Date 2024-05-11  Status NO AUTH REQUIRED  Message PRECERT IS NOT REQUIRED OR ONLY REQUIRED IN UNIQUE CIRCUMSTANCES FOR MEDICAID REFER TO PRIOR AUTH TOOL ON [email or web address] FOR ALL OTHERS REFER TO CODE SEARCH TOOL ON [email or web address] COVERAGE OF SERVICES ARE SUBJECT TO BENEFITS AND ELIGIBILITY

## 2024-05-14 ENCOUNTER — Inpatient Hospital Stay: Attending: Oncology | Admitting: Oncology

## 2024-05-14 ENCOUNTER — Inpatient Hospital Stay

## 2024-05-14 VITALS — BP 121/85 | HR 69 | Temp 98.2°F | Resp 18 | Ht 70.0 in | Wt 299.2 lb

## 2024-05-14 DIAGNOSIS — K5900 Constipation, unspecified: Secondary | ICD-10-CM | POA: Diagnosis not present

## 2024-05-14 DIAGNOSIS — D500 Iron deficiency anemia secondary to blood loss (chronic): Secondary | ICD-10-CM

## 2024-05-14 DIAGNOSIS — K5190 Ulcerative colitis, unspecified, without complications: Secondary | ICD-10-CM | POA: Diagnosis not present

## 2024-05-14 DIAGNOSIS — D509 Iron deficiency anemia, unspecified: Secondary | ICD-10-CM | POA: Diagnosis present

## 2024-05-14 DIAGNOSIS — I4891 Unspecified atrial fibrillation: Secondary | ICD-10-CM | POA: Diagnosis not present

## 2024-05-14 LAB — CBC WITH DIFFERENTIAL/PLATELET
Abs Immature Granulocytes: 0.03 K/uL (ref 0.00–0.07)
Basophils Absolute: 0 K/uL (ref 0.0–0.1)
Basophils Relative: 0 %
Eosinophils Absolute: 0.4 K/uL (ref 0.0–0.5)
Eosinophils Relative: 6 %
HCT: 38 % (ref 36.0–46.0)
Hemoglobin: 11.2 g/dL — ABNORMAL LOW (ref 12.0–15.0)
Immature Granulocytes: 0 %
Lymphocytes Relative: 18 %
Lymphs Abs: 1.2 K/uL (ref 0.7–4.0)
MCH: 20.6 pg — ABNORMAL LOW (ref 26.0–34.0)
MCHC: 29.5 g/dL — ABNORMAL LOW (ref 30.0–36.0)
MCV: 69.9 fL — ABNORMAL LOW (ref 80.0–100.0)
Monocytes Absolute: 0.5 K/uL (ref 0.1–1.0)
Monocytes Relative: 8 %
Neutro Abs: 4.5 K/uL (ref 1.7–7.7)
Neutrophils Relative %: 68 %
Platelets: 237 K/uL (ref 150–400)
RBC: 5.44 MIL/uL — ABNORMAL HIGH (ref 3.87–5.11)
RDW: 21.4 % — ABNORMAL HIGH (ref 11.5–15.5)
Smear Review: NORMAL
WBC: 6.7 K/uL (ref 4.0–10.5)
nRBC: 0 % (ref 0.0–0.2)

## 2024-05-14 LAB — IRON AND TIBC
Iron: 19 ug/dL — ABNORMAL LOW (ref 28–170)
Saturation Ratios: 5 % — ABNORMAL LOW (ref 10.4–31.8)
TIBC: 408 ug/dL (ref 250–450)
UIBC: 389 ug/dL

## 2024-05-14 LAB — COMPREHENSIVE METABOLIC PANEL WITH GFR
ALT: 22 U/L (ref 0–44)
AST: 19 U/L (ref 15–41)
Albumin: 3.3 g/dL — ABNORMAL LOW (ref 3.5–5.0)
Alkaline Phosphatase: 98 U/L (ref 38–126)
Anion gap: 13 (ref 5–15)
BUN: 9 mg/dL (ref 6–20)
CO2: 22 mmol/L (ref 22–32)
Calcium: 9.3 mg/dL (ref 8.9–10.3)
Chloride: 104 mmol/L (ref 98–111)
Creatinine, Ser: 0.54 mg/dL (ref 0.44–1.00)
GFR, Estimated: >60 mL/min (ref >60)
Glucose, Bld: 89 mg/dL (ref 70–99)
Potassium: 4.2 mmol/L (ref 3.5–5.1)
Sodium: 139 mmol/L (ref 135–145)
Total Bilirubin: <0.2 mg/dL (ref 0.0–1.2)
Total Protein: 7.3 g/dL (ref 6.5–8.1)

## 2024-05-14 LAB — FERRITIN: Ferritin: 4 ng/mL — ABNORMAL LOW (ref 11–307)

## 2024-05-14 LAB — VITAMIN B12: Vitamin B-12: 526 pg/mL (ref 180–914)

## 2024-05-14 LAB — FOLATE: Folate: 17.4 ng/mL (ref >5.9)

## 2024-05-14 NOTE — Assessment & Plan Note (Addendum)
 Discussed with patient that since supplementation can cause copper deficiency.  - Discontinue zinc if she has no previously diagnosed zinc deficiency - Will obtain a copper level today

## 2024-05-14 NOTE — Progress Notes (Signed)
 [facility] at [facility]  HEMATOLOGY NEW VISIT  Jolee, [name], [name]  REASON FOR REFERRAL: Iron deficiency anemia   HISTORY OF PRESENT ILLNESS: Brandi Meyers 48 y.o. female referred for iron deficiency anemia.  She was seen by Dr. Cinderella for iron deficiency anemia and was referred for the same.  She has a past medical history of ulcerative colitis, A-fib, spinal cord tumor s/p radiation.  Denies melena or hematochezia. She has not experienced recent weight loss but reports a longstanding issue with appetite, feeling hungry but not wanting to eat, and sometimes not finishing meals. Despite this, she has gained approximately 20 pounds over the last year.She reports craving and consuming a lot of ice. No fatigue, but she has experienced numbness and tingling in some fingertips over the last few months.She is scheduled for an endoscopy and colonoscopy next month.  Her menstrual history is significant for irregular periods since receiving radiation therapy on her lower back at age 79. Her periods have been almost nonexistent.  She is not currently taking iron supplements. She has been taking zinc tablets as a COVID-19 precaution, which she started based on a recommendation from her cousin's doctor.  I have reviewed the past medical history, past surgical history, social history and family history with the patient   ALLERGIES:  is allergic to latex.  MEDICATIONS:  Current Outpatient Medications  Medication Sig Dispense Refill   albuterol (VENTOLIN HFA) 108 (90 Base) MCG/ACT inhaler TAKE 2 PUFFS BY MOUTH 4 TIMES A DAY     b complex vitamins capsule Take by mouth.     Cholecalciferol 25 MCG (1000 UT) tablet Take by mouth.     clotrimazole-betamethasone (LOTRISONE) cream as needed.     DULoxetine (CYMBALTA) 30 MG capsule TAKE 1 CAPSULE BY MOUTH EVERY DAY WITH 60MG  DOSE     DULoxetine (CYMBALTA) 60 MG capsule Take 60 mg by mouth every morning.     ibuprofen (ADVIL) 100  MG/5ML suspension Take 200 mg by mouth every 4 (four) hours as needed. (Patient taking differently: Take 200 mg by mouth daily.)     metFORMIN (GLUCOPHAGE-XR) 750 MG 24 hr tablet Take 2 tablets by mouth every morning. (Patient taking differently: Take 2 tablets by mouth every morning.)     metoprolol  succinate (TOPROL -XL) 25 MG 24 hr tablet TAKE 1 TABLET (25 MG TOTAL) BY MOUTH DAILY. 90 tablet 0   Multiple Vitamin (MULTIVITAMIN WITH MINERALS) TABS tablet Take 1 tablet by mouth daily.     rosuvastatin  (CRESTOR ) 20 MG tablet Take 1 tablet (20 mg total) by mouth daily. NEED OV. (Patient taking differently: Take 20 mg by mouth in the morning and at bedtime. NEED OV.) 90 tablet 0   triamcinolone (KENALOG) 0.1 % APPLY TO AFFECTED AREA TWICE A DAY     Zinc 50 MG TABS Take by mouth.     No current facility-administered medications for this visit.     REVIEW OF SYSTEMS:   Constitutional: Denies fevers, chills or night sweats Eyes: Denies blurriness of vision Ears, nose, mouth, throat, and face: Denies mucositis or sore throat Respiratory: Denies cough, dyspnea or wheezes Cardiovascular: Denies palpitation, chest discomfort or lower extremity swelling Gastrointestinal:  Denies nausea, heartburn or change in bowel habits Skin: Denies abnormal skin rashes Lymphatics: Denies new lymphadenopathy or easy bruising Neurological:Denies numbness, tingling or new weaknesses Behavioral/Psych: Mood is stable, no new changes  All other systems were reviewed with the patient and are negative.  PHYSICAL EXAMINATION:  Vitals:   05/14/24 1105  BP: 121/85  Pulse: 69  Resp: 18  Temp: 98.2 F (36.8 C)  SpO2: 99%    GENERAL:alert, no distress and comfortable SKIN: skin color, texture, turgor are normal, no rashes or significant lesions LUNGS: clear to auscultation and percussion with normal breathing effort HEART: regular rate & rhythm and no murmurs and no lower extremity edema ABDOMEN:abdomen soft,  non-tender and normal bowel sounds Musculoskeletal:no cyanosis of digits and no clubbing  NEURO: alert & oriented x 3 with fluent speech  LABORATORY DATA:  I have reviewed the data as listed on labs from primary care drawn on 03/15/2024 CBC: WBC: 5.90, hemoglobin: 10.9, hematocrit: 33.4, MCV: 63.5, platelets: 228 CMP: WNL Also labs from [facility] on 04/07/2024: Iron: 18, TIBC: 356, ferritin: 8, TSAT: 5 Folate: 7.8, vitamin B12: 597 CBC: WBC: 6.7, hemoglobin: 10.7, hematocrit: 36, MCV: 68, platelets: 247     Chemistry      Component Value Date/Time   NA 141 04/26/2022 1031   K 4.5 04/26/2022 1031   CL 104 04/26/2022 1031   CO2 26 04/26/2022 1031   BUN 7 04/26/2022 1031   CREATININE 0.56 (L) 04/26/2022 1031      Component Value Date/Time   CALCIUM  9.8 04/26/2022 1031       RADIOGRAPHIC STUDIES: I have personally reviewed the radiological images as listed and agreed with the findings in the report.  None new to review  ASSESSMENT & PLAN:  Patient is a 48 y.o. female referred for iron deficiency anemia  Assessment & Plan Iron deficiency anemia due to chronic blood loss The most likely cause of her anemia is due to chronic blood loss or malabsorption syndrome.  Last colonoscopy/endoscopy: Plan to be done in August   -We discussed some of the risks, benefits, and alternatives of intravenous iron infusions. The patient is symptomatic from anemia and the iron level is critically low. She di not start oral iron supplement and desires to achieve higher levels of iron faster for adequate hematopoesis. Some of the side-effects to be expected including risks of infusion reactions, phlebitis, headaches, nausea and fatigue.  The patient is willing to proceed. Patient education material was dispensed. Goal is to keep ferritin level greater than 50 and resolution of anemia - Start oral iron every other day and hold it for endoscopy/colonoscopy.  Use MiraLAX for constipation - Will obtain  baseline labs today.  Return to clinic in 8 weeks with labs to assess response to IV iron  Zinc excess Discussed with patient that since supplementation can cause copper deficiency.  - Discontinue zinc if she has no previously diagnosed zinc deficiency - Will obtain a copper level today     Orders Placed This Encounter  Procedures   Ferritin    Standing Status:   Future    Number of Occurrences:   1    Expected Date:   05/14/2024    Expiration Date:   08/12/2024   Folate    Standing Status:   Future    Number of Occurrences:   1    Expected Date:   05/14/2024    Expiration Date:   08/12/2024   Vitamin B12    Standing Status:   Future    Number of Occurrences:   1    Expected Date:   05/14/2024    Expiration Date:   08/12/2024   CBC with Differential/Platelet    Standing Status:   Future    Number of Occurrences:  1    Expected Date:   05/14/2024    Expiration Date:   08/12/2024   Comprehensive metabolic panel with GFR    Standing Status:   Future    Number of Occurrences:   1    Expected Date:   05/14/2024    Expiration Date:   08/12/2024   Iron and TIBC    Standing Status:   Future    Number of Occurrences:   1    Expected Date:   05/14/2024    Expiration Date:   08/12/2024   Copper, serum    Standing Status:   Future    Number of Occurrences:   1    Expected Date:   05/14/2024    Expiration Date:   05/14/2025    The total time spent in the appointment was 40 minutes encounter with patients including review of chart and various tests results, discussions about plan of care and coordination of care plan   All questions were answered. The patient knows to call the clinic with any problems, questions or concerns. No barriers to learning was detected.   Mickiel Dry, MD 7/18/20251:23 PM

## 2024-05-14 NOTE — Patient Instructions (Signed)
 VISIT SUMMARY:  You came in today for an evaluation and management of your iron deficiency anemia. We discussed your history, including your irregular periods, past ulcerative colitis, and recent weight gain. You also mentioned your craving for ice and occasional numbness in your fingertips. We have planned further investigations and treatments to address your condition.  YOUR PLAN:  -IRON DEFICIENCY ANEMIA: Iron deficiency anemia means you have low levels of iron in your blood, which can cause symptoms like fatigue and cravings for non-food items like ice. We suspect non-menstrual blood loss as the cause. You will receive two doses of IV iron with medications to prevent side effects, and we will recheck your iron levels in eight weeks. After your colonoscopy, you will start taking oral iron supplements. We will also do blood work today to get a baseline and schedule a follow-up in eight weeks.  -ULCERATIVE COLITIS: Ulcerative colitis is a condition that causes inflammation and sores in the digestive tract. You have a history of this condition but have been asymptomatic for years. No current treatment is needed.   -ZINC SUPPLEMENTATION: You have been taking zinc supplements to prevent COVID-19, but this can lead to a copper deficiency. We will check your copper levels and advise you to stop taking zinc if it is not necessary.  INSTRUCTIONS:  Please follow up in eight weeks for a recheck of your iron levels and to discuss the results of your endoscopy and colonoscopy. Make sure to complete your blood work today. If you experience any new symptoms or have concerns before your next appointment, please contact our office.

## 2024-05-14 NOTE — Assessment & Plan Note (Addendum)
 The most likely cause of her anemia is due to chronic blood loss or malabsorption syndrome.  Last colonoscopy/endoscopy: Plan to be done in August   -We discussed some of the risks, benefits, and alternatives of intravenous iron infusions. The patient is symptomatic from anemia and the iron level is critically low. She di not start oral iron supplement and desires to achieve higher levels of iron faster for adequate hematopoesis. Some of the side-effects to be expected including risks of infusion reactions, phlebitis, headaches, nausea and fatigue.  The patient is willing to proceed. Patient education material was dispensed. Goal is to keep ferritin level greater than 50 and resolution of anemia - Start oral iron every other day and hold it for endoscopy/colonoscopy.  Use MiraLAX for constipation - Will obtain baseline labs today.  Return to clinic in 8 weeks with labs to assess response to IV iron

## 2024-05-15 LAB — COPPER, SERUM: Copper: 133 ug/dL (ref 80–158)

## 2024-05-18 ENCOUNTER — Inpatient Hospital Stay

## 2024-05-18 VITALS — BP 145/78 | HR 60 | Temp 98.4°F | Resp 16

## 2024-05-18 DIAGNOSIS — D509 Iron deficiency anemia, unspecified: Secondary | ICD-10-CM | POA: Diagnosis not present

## 2024-05-18 DIAGNOSIS — D500 Iron deficiency anemia secondary to blood loss (chronic): Secondary | ICD-10-CM

## 2024-05-18 MED ORDER — ACETAMINOPHEN 325 MG PO TABS
650.0000 mg | ORAL_TABLET | Freq: Once | ORAL | Status: AC
Start: 2024-05-18 — End: 2024-05-18
  Administered 2024-05-18: 650 mg via ORAL
  Filled 2024-05-18: qty 2

## 2024-05-18 MED ORDER — SODIUM CHLORIDE 0.9 % IV SOLN: INTRAVENOUS | Status: DC

## 2024-05-18 MED ORDER — SODIUM CHLORIDE 0.9 % IV SOLN
500.0000 mg | Freq: Once | INTRAVENOUS | Status: AC
  Administered 2024-05-18: 500 mg via INTRAVENOUS
  Filled 2024-05-18: qty 25

## 2024-05-18 MED ORDER — CETIRIZINE HCL 10 MG PO TABS
10.0000 mg | ORAL_TABLET | Freq: Once | ORAL | Status: AC
Start: 2024-05-18 — End: 2024-05-18
  Administered 2024-05-18: 10 mg via ORAL
  Filled 2024-05-18: qty 1

## 2024-05-18 NOTE — Patient Instructions (Signed)

## 2024-05-18 NOTE — Progress Notes (Signed)
 Patient tolerated iron infusion with no complaints voiced.  Peripheral IV site clean and dry with good blood return noted before and after infusion.  Band aid applied.  VSS with discharge and left in satisfactory condition with no s/s of distress noted.

## 2024-05-19 NOTE — Progress Notes (Signed)
 [facility] PHYSICAL THERAPY [place] [facility] 05/19/2024 Note Type: Treatment Note    Patient Name: Brandi Meyers Date of Birth:09-Nov-1975 Diagnosis:  Encounter Diagnosis  Name Primary?  . Mixed stress and urge urinary incontinence Yes   Referring MD:  Davie Mliss DELENA MILDERD*   Date of Onset of Impairment-No date available Date PT Care Plan Established or Reviewed-05/11/2024 Date PT Treatment Started-05/11/2024  Visit Count: 2 Plan of Care Effective Date: 05/11/2024 - 07/12/2024    2 of 12 visits, Reassessment due 06/11/24 (2 of 12 authorized) Assessment/Plan:   Assessment Assessment details:    Pt demonstrates good tolerance of PT with addition of TA training and progression of PFM strengthening  and coordination of PFM/TA contraction with ADLs for improved function with decreased urinary leakage. Pt is also dealing with news of low iron  and having multiple infusions this week and next then will have colonoscopy and endoscopy the week after next. HEP was updated and pt demonstrates good understanding. Will continue to progress as tol.  Patient is a 48 yo female who presents to pelvic floor physical therapy for evaluation with signs and symptoms consistent with urinary dysfunction and resultant limitations and compensations. Patient demonstrates altered voiding habits, increased urinary frequency, increased urinary urge, nocturia, urinary leaking, decreased core and pelvic floor strength, and poor coordination of muscular control.  Further assessment of core and pelvic floor strength, resting tonicity, coordination of muscular control will be necessary at a future visit.  Recommend continued skilled physical therapy to address muscle weakness, decreased mm endurance, and postural deficits via neuromuscular reed, manual therapy, biodfeedback, exercise prescription, and patient education.   All information regarding the expectations of participation in pelvic floor physical therapy,  including but not limited to, the need for internal and/or external pelvic floor assessment, treatment techniques and plan of care were thoroughly discussed.  Patient confirms understanding that (s)he may bring a third party to any or all physical therapy sessions and can withdraw consent at any point throughout the plan of care.  Patient and guardian (if applicable) verbalize understanding of all above information and consent to pelvic floor assessment and treatment at evaluation and all future physical therapy sessions.     HOME EXERCISE PROGRAM: Patient was provided relevant education topics pertinent to their concerns and issues. Written and/or digital home exercise program and education pages also provided as needed/appropriate.        Impairments: decreased strength, poor bowel/bladder habits, postural weakness, urinary incontinence, poor awareness of body mechanics, impaired motor control and core weakness     Prognosis: excellent prognosis   Personal Factors/Comorbidities: 3+   Specific Comorbidities: h/o spinal cord tumors, OA, anemia, depression/anxiety, heart trouble, motion sickness   Examination of Body Systems: urogenital, gastrointestinal, neurological, musculoskeletal and activity/participation   Clinical Decision Making: moderate   Positive Prognosis Rationale: behavior, motivated for treatment, insight and language.   Clinical Presentation: stable  Therapy Goals     Goals:     Patient Goals: to strengthen pelvic floor to decrease incontinence   Short Term Goals to be reached in 4 weeks:  Pt will demonstrate: 1-Independence with initial HEP for self-management of sxs. 2-Understanding of and need for consistency with behavioral changes such as but not limited to appropriate hydration, potential changes to diet, routine diaphragmatic breathing, daily HEP practice, improved defecation mechanics and voiding habits including use of squatty potty for voiding, relaxation  exercises, mindfulness activities, and use of physical supports. 3-Levator ani strength >/= 3/5 with >/= 5 second hold  followed by complete relaxation for improved pelvic floor muscle strength and coordination, for improved closure of sphincters to progress toward preventing incontinence. 4-Rising no more than 2 times per night to urinate.  Long Term Goals to be reached in 8 weeks:  Pt will demonstrate: 1-Independence with HEP for self-management of sxs. 2-Rising no more than 1 time per night to urinate. 3-Urinary incontinence prevented 90% of time with cough/laugh/sneeze/lift for improved quality of life, skin integrity, and return to prior level of function. 4-Bowel movements classified as type III-IV on Bristol Stool Chart for improved stool consistency and improved ability to have BM without pushing. 5-Levator ani strength >/= 4/5 with >/= 10 second hold followed by complete relaxation for improved pelvic floor muscle strength and coordination, for improved closure of sphincters to prevent incontinence.                Plan   Therapy options: will be seen for skilled physical therapy services   Planned therapy interventions: Manual Therapy, Neuromuscular Re-education, Postural Training, Home Exercise Program, Functional Mobility, Education - Patient, Endurance Activites, Diaphragmatic/Pursed-lip Breathing, Bladder Retraining, Balance Training, Civil engineer, contracting, 79439, [phone] Needling 1-2, 3+ areas, 97010-Cold Packs/Hot Packs, [id number], G0283-Electrical Stimulation (unattended, attended), 97110-Therapeutic Exercises, 97112-Neuromuscular Re-education, 97140-Manual Therapy, 97530-Therapeutic Activities, 97535-Self-Care/Home Training and 97750-Physical Performance Test    Frequency: 1x week   Duration in weeks: 8   Education provided to: patient.   Education provided: Anatomy, Bladder education/retraining, Fluid intake, HEP, Urinary urge inhibition strategies, Treatment options  and plan, Symptom management, LA contraction to prevent SU, Education regarding bladder irritants, Diet modifications and Body awareness   Education results: demonstrates understanding, needs further instruction and verbalized good understanding.   Communication/Consultation: Initial note sent to Referring Provider.   Next visit plan:       Progress w core and PFM strengthening and coordination training   Total Session Time: 65   Treatment rendered today:     THERAPEUTIC EXERCISE: 49 Minutes: Reviewed for HEP: -PFM pt ed in contraction, and in coordination w breathing -Pelvic Floor Strengthening: Endurance contractions: Hold for  5 seconds /Relax 10 seconds -Supine overflow exercise PFM contraction w hook lying isometric bil hip adduction w pillow squeeze Added: -Pt ed in TA contraction in hook lying -Hook lying TA contraction w 3 sec holds x 5 -Hook lying bridge w PFM/TA contraction 10 reps w 3 sec holds -Supine 90/90 alternate LE extensions 3 reps x 2 -Standing leaning forward w BUE support on raised mat table, cat/cow 5 reps -Standing leaning forward w BUE support on raised mat table, modified bird dogs w contralateral UE/LE lifts 10 reps -Home Exercise Program for endurance and quick contractions  THERAPEUTIC ACTIVITIES: 9 minutes Reviewed: -Bladder Retraining  -Urge suppression techniques  -Hydration education/instruction Added: -Coordination of PFM/TA activation with ADLs, transfers    Held: -Bladder irritants -pelvic bracing            Plan details: Therapeutic exercise, therapeutic activities, Neuromuscular Re-education, gt training, work conditioning/Job simulation, and pt education to improve independence and safety with gt, ADLs, and self care. Modalities (heat, ice, ultrasound, electrical stimulation, dry needling) as needed for pain management. Massage, mobilization and manual therapy as needed to increase ROM in restricted soft tissues and joints.     Subjective:   History of Present Condition    History of Present Condition/Chief Complaint:  Pt is a 48 yo female referred to PT for mixed urge and stress incontinence. Subjective:  05/19/24 Pt reports she had to go  for iron  infusion yesterday Found to have low hemoglobin on annual exam in May. Saw GI doctor then hematologist.  Pt reports having a couple really good days right after last PT appt with minimal leaking, but then has had some really bad days since then with lots of UI. Pt has been very busy and stressed though with infusion appt and son's sleep study this week.   On initial eval: Pt reports UI began over 5 years ago, worsening gradually with drastic worsening over the past few months. Pt reports feeling her control of bladder function is better in the morning then it decreases as the day goes on. Pt is a Runner, broadcasting/film/video working full time. 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 Pt notes concern that UI may be due to scar tissue related to this treatment. Pain:    Current pain rating:  0   At best pain rating:  0   At worst pain rating:  2  Location:  Low back occasionally   Quality:  Stiffness   Pain related Behaviors:  None   Red Flags:  None Precautions/Equipment  Precautions:  None and Cancer history   Current Braces/Orthoses:  None   Equipment Currently Used:  None Prior Functional Status    No physical limitations   Current Functional Status:   limited household activities, limited travel, leisure activities, limited exercise, limited recreation and disturbed sleep Social Support:    Lives with:  Spouse   Communication Preference:  Verbal, written and visual Barriers to Learning:  No Barriers Diagnostic Tests:    MRI studies: normal   Treatments:    None   Patient Goals:    Patient/Family goals for therapy:  Decrease/Eliminate UI, improve bladder function, increased strength, improved sleep, return  to recreational activites and return to sport/leisure activities   Objective:    Tests    Functional Assessment  Functional Assessment Comments-Pt reports leaking w coughing, sneezing but has started having increased leaking at all times. Pt reports frequent urge to urinate, with leaking frequently on the way to the bathroom. Pt wears a thick pad now at all times.  Arising 2-4 times per night to urinate. Daily fluid intake: 16 oz water, 8 oz coffee, 8 oz carbonated drink, 16+ oz sweet tea Bowel movements 4-6 per week, Bristol stool scale type  Recently has started drinking less closer to bedtime  Pt reports at least 2-3 leaks per day requiring 2-3 pad changes and sometimes needing to change her clothes Pt has had irregular periods for many years Pt has not birthed any babies, is occ sexually active with husband without pain or difficulty. Pt reports awareness of PFM and doing frequent PFM exercise, tries to contract to decrease urge to allow getting to the bathroom but notes it has the opposite effect and she pees instead.  Pelvic Floor External exam: Skin integrity normal, no scar, Introitus normal, neurologic normal Tone WNL, no pain w palpation BLEs, gluts, piriformis,  lower abdomen Absent cough reflex Internal Vaginal Exam: Sensation WNL, muscle tone WNL Vaginal muscle strength based on digital palpation: Bulbo: 3/5, LA 2/5 Pt can hold contraction for 2 seconds, 3 times consecutively No pain reported on initial penetration or with palpation during digital exam Coordination w TA: not addressed Coordination w diaphragm: poor initially, good w verbal and tactile cueing   Pelvic Outcome Measures: Incontinence Impact Questionnaire score = 13/21 indicating 62% functional impairment  I attest that I have reviewed the above information. Signed: Sari GORMAN Minder, PT 05/19/2024 2:06 PM

## 2024-05-25 ENCOUNTER — Inpatient Hospital Stay (HOSPITAL_BASED_OUTPATIENT_CLINIC_OR_DEPARTMENT_OTHER)

## 2024-05-25 VITALS — BP 138/81 | HR 58 | Temp 97.8°F | Resp 18

## 2024-05-25 DIAGNOSIS — D509 Iron deficiency anemia, unspecified: Secondary | ICD-10-CM | POA: Diagnosis not present

## 2024-05-25 DIAGNOSIS — D500 Iron deficiency anemia secondary to blood loss (chronic): Secondary | ICD-10-CM

## 2024-05-25 MED ORDER — SODIUM CHLORIDE 0.9 % IV SOLN: INTRAVENOUS | Status: DC

## 2024-05-25 MED ORDER — CETIRIZINE HCL 10 MG PO TABS
10.0000 mg | ORAL_TABLET | Freq: Once | ORAL | Status: AC
Start: 2024-05-25 — End: 2024-05-25
  Administered 2024-05-25: 10 mg via ORAL
  Filled 2024-05-25: qty 1

## 2024-05-25 MED ORDER — ACETAMINOPHEN 325 MG PO TABS
650.0000 mg | ORAL_TABLET | Freq: Once | ORAL | Status: AC
Start: 2024-05-25 — End: 2024-05-25
  Administered 2024-05-25: 650 mg via ORAL
  Filled 2024-05-25: qty 2

## 2024-05-25 MED ORDER — SODIUM CHLORIDE 0.9 % IV SOLN
500.0000 mg | Freq: Once | INTRAVENOUS | Status: AC
  Administered 2024-05-25: 500 mg via INTRAVENOUS
  Filled 2024-05-25: qty 25

## 2024-05-25 NOTE — Progress Notes (Signed)
Patient presents today for iron infusion.  Patient is in satisfactory condition with no new complaints voiced.  Vital signs are stable.  IV placed in L arm.  IV flushed well with good blood return noted.   We will proceed with infusion per provider orders.    Patient tolerated infusion well with no complaints voiced.  Patient left ambulatory in stable condition.  Vital signs stable at discharge.  Follow up as scheduled.    

## 2024-05-25 NOTE — Patient Instructions (Signed)
 [facility] [place] - A DEPT OF [facility]  Discharge Instructions: Thank you for choosing [facility] to provide your oncology and hematology care.  If you have a lab appointment with the Cancer Center - please note that after April 8th, 2024, all labs will be drawn in the cancer center.  You do not have to check in or register with the main entrance as you have in the past but will complete your check-in in the cancer center.  Wear comfortable clothing and clothing appropriate for easy access to any Portacath or PICC line.   We strive to give you quality time with your provider. You may need to reschedule your appointment if you arrive late (15 or more minutes).  Arriving late affects you and other patients whose appointments are after yours.  Also, if you miss three or more appointments without notifying the office, you may be dismissed from the clinic at the provider's discretion.      For prescription refill requests, have your pharmacy contact our office and allow 72 hours for refills to be completed.    Today you received the following:  Venofer.    Iron Sucrose Injection What is this medication? IRON SUCROSE (EYE ern SOO krose) treats low levels of iron (iron deficiency anemia) in people with kidney disease. Iron is a mineral that plays an important role in making red blood cells, which carry oxygen from your lungs to the rest of your body. This medicine may be used for other purposes; ask your health care provider or pharmacist if you have questions. COMMON BRAND NAME(S): Venofer What should I tell my care team before I take this medication? They need to know if you have any of these conditions: Anemia not caused by low iron levels Heart disease High levels of iron in the blood Kidney disease Liver disease An unusual or allergic reaction to iron, other medications, foods, dyes, or preservatives Pregnant or trying to get  pregnant Breastfeeding How should I use this medication? This medication is infused into a vein. It is given by your care team in a hospital or clinic setting. Talk to your care team about the use of this medication in children. While it may be prescribed for children as young as 2 years for selected conditions, precautions do apply. Overdosage: If you think you have taken too much of this medicine contact a poison control center or emergency room at once. NOTE: This medicine is only for you. Do not share this medicine with others. What if I miss a dose? Keep appointments for follow-up doses. It is important not to miss your dose. Call your care team if you are unable to keep an appointment. What may interact with this medication? Do not take this medication with any of the following: Deferoxamine Dimercaprol Other iron products This medication may also interact with the following: Chloramphenicol Deferasirox This list may not describe all possible interactions. Give your health care provider a list of all the medicines, herbs, non-prescription drugs, or dietary supplements you use. Also tell them if you smoke, drink alcohol, or use illegal drugs. Some items may interact with your medicine. What should I watch for while using this medication? Visit your care team for regular checks on your progress. Tell your care team if your symptoms do not start to get better or if they get worse. You may need blood work done while you are taking this medication. You may need to eat  more foods that contain iron. Talk to your care team. Foods that contain iron include whole grains or cereals, dried fruits, beans, peas, leafy green vegetables, and organ meats (liver, kidney). What side effects may I notice from receiving this medication? Side effects that you should report to your care team as soon as possible: Allergic reactions--skin rash, itching, hives, swelling of the face, lips, tongue, or throat Low  blood pressure--dizziness, feeling faint or lightheaded, blurry vision Shortness of breath Side effects that usually do not require medical attention (report to your care team if they continue or are bothersome): Flushing Headache Joint pain Muscle pain Nausea Pain, redness, or irritation at injection site This list may not describe all possible side effects. Call your doctor for medical advice about side effects. You may report side effects to FDA at 1-800-FDA-1088. Where should I keep my medication? This medication is given in a hospital or clinic. It will not be stored at home. NOTE: This sheet is a summary. It may not cover all possible information. If you have questions about this medicine, talk to your doctor, pharmacist, or health care provider.  2024 Elsevier/Gold Standard (2023-06-04 00:00:00)   To help prevent nausea and vomiting after your treatment, we encourage you to take your nausea medication as directed.  BELOW ARE SYMPTOMS THAT SHOULD BE REPORTED IMMEDIATELY: *FEVER GREATER THAN 100.4 F (38 C) OR HIGHER *CHILLS OR SWEATING *NAUSEA AND VOMITING THAT IS NOT CONTROLLED WITH YOUR NAUSEA MEDICATION *UNUSUAL SHORTNESS OF BREATH *UNUSUAL BRUISING OR BLEEDING *URINARY PROBLEMS (pain or burning when urinating, or frequent urination) *BOWEL PROBLEMS (unusual diarrhea, constipation, pain near the anus) TENDERNESS IN MOUTH AND THROAT WITH OR WITHOUT PRESENCE OF ULCERS (sore throat, sores in mouth, or a toothache) UNUSUAL RASH, SWELLING OR PAIN  UNUSUAL VAGINAL DISCHARGE OR ITCHING   Items with * indicate a potential emergency and should be followed up as soon as possible or go to the Emergency Department if any problems should occur.  Please show the CHEMOTHERAPY ALERT CARD or IMMUNOTHERAPY ALERT CARD at check-in to the Emergency Department and triage nurse.  Should you have questions after your visit or need to cancel or reschedule your appointment, please contact [facility] CANCER  CT[facility] [place] - A DEPT OF [facility] [phone]  and follow the prompts.  Office hours are 8:00 a.m. to 4:30 p.m. Monday - Friday. Please note that voicemails left after 4:00 p.m. may not be returned until the following business day.  We are closed weekends and major holidays. You have access to a nurse at all times for urgent questions. Please call the main number to the clinic [phone] and follow the prompts.  For any non-urgent questions, you may also contact your provider using MyChart. We now offer e-Visits for anyone 24 and older to request care online for non-urgent symptoms. For details visit [email or web address].   Also download the MyChart app! Go to the app store, search "MyChart", open the app, select [place], and log in with your MyChart username and password.

## 2024-05-27 ENCOUNTER — Other Ambulatory Visit: Payer: Self-pay | Admitting: Medical Genetics

## 2024-05-31 ENCOUNTER — Other Ambulatory Visit (HOSPITAL_COMMUNITY)
Admission: RE | Admit: 2024-05-31 | Discharge: 2024-05-31 | Disposition: A | Discharge status: Home / Self Care | Source: Ambulatory Visit | Attending: Oncology | Admitting: Oncology

## 2024-05-31 ENCOUNTER — Other Ambulatory Visit: Payer: Self-pay | Admitting: *Deleted

## 2024-05-31 ENCOUNTER — Telehealth: Payer: Self-pay | Admitting: *Deleted

## 2024-05-31 ENCOUNTER — Ambulatory Visit (HOSPITAL_COMMUNITY)
Admission: RE | Admit: 2024-05-31 | Discharge: 2024-05-31 | Disposition: A | Discharge status: Home / Self Care | Source: Ambulatory Visit | Attending: Oncology | Admitting: Oncology

## 2024-05-31 ENCOUNTER — Ambulatory Visit: Payer: Self-pay | Admitting: *Deleted

## 2024-05-31 DIAGNOSIS — M79609 Pain in unspecified limb: Secondary | ICD-10-CM

## 2024-05-31 DIAGNOSIS — M7989 Other specified soft tissue disorders: Secondary | ICD-10-CM | POA: Diagnosis present

## 2024-05-31 NOTE — Telephone Encounter (Signed)
 Patient walked in clinic today, following iron  infusion on Wednesday. Vein in the [facility] is extremely distended and very hard to the touch, associated with pain.  Assessed by Dr. Davonna as well.  Per recommendations, will obtain US  to left extremity today.  Patient instructed to use ice to the area.

## 2024-05-31 NOTE — Progress Notes (Signed)
Patient notified of results and recommendations. Verbalized understanding

## 2024-06-01 ENCOUNTER — Encounter (HOSPITAL_COMMUNITY)
Admission: RE | Admit: 2024-06-01 | Discharge: 2024-06-01 | Disposition: A | Discharge status: Home / Self Care | Source: Ambulatory Visit | Attending: Gastroenterology | Admitting: Gastroenterology

## 2024-06-01 VITALS — Ht 70.0 in | Wt 299.2 lb

## 2024-06-01 DIAGNOSIS — Z01818 Encounter for other preprocedural examination: Secondary | ICD-10-CM

## 2024-06-01 HISTORY — DX: Unspecified atrial fibrillation: I48.91

## 2024-06-01 NOTE — Progress Notes (Signed)
 [facility] PHYSICAL THERAPY [facility] 06/01/2024 Note Type: Treatment Note    Patient Name: Brandi Meyers Date of Birth:Oct 29, 1975 Diagnosis:  Encounter Diagnosis  Name Primary?  . Mixed stress and urge urinary incontinence Yes   Referring MD:  Davie Mliss DELENA MILDERD*   Date of Onset of Impairment-No date available Date PT Care Plan Established or Reviewed-05/11/2024 Date PT Treatment Started-05/11/2024  Visit Count: 3 Plan of Care Effective Date: 05/11/2024 - 07/12/2024    3 of 12 visits, Reassessment due 06/11/24 (3 of 12 authorized) Assessment/Plan:   Assessment Assessment details:    Pt demonstrates good progress w PT with decreased urinary leakage and gradually improving pt ability to suppress the urge to urinate and improved ability to . Pt currently is being treated for UTI w noted regression in ability to control urinary leakage. Pt also presents today w c/o knee pain and w h/o having a superficial blood clot in her LUE following her infusion last week. Pt to have colonoscopy and endoscopy next week. HEP was updated and pt demonstrates good understanding. 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 Pt will continue HEP and contact PT with any questions and call to make further appt when feels ready for update to HEP. Will continue to progress as tol.  Patient is a 48 yo female who presents to pelvic floor physical therapy for evaluation with signs and symptoms consistent with urinary dysfunction and resultant limitations and compensations. Patient demonstrates altered voiding habits, increased urinary frequency, increased urinary urge, nocturia, urinary leaking, decreased core and pelvic floor strength, and poor coordination of muscular control.  Further assessment of core and pelvic floor strength, resting tonicity, coordination of muscular control will be necessary at a future visit.  Recommend continued skilled physical therapy to address muscle weakness,  decreased mm endurance, and postural deficits via neuromuscular reed, manual therapy, biodfeedback, exercise prescription, and patient education.   All information regarding the expectations of participation in pelvic floor physical therapy, including but not limited to, the need for internal and/or external pelvic floor assessment, treatment techniques and plan of care were thoroughly discussed.  Patient confirms understanding that (s)he may bring a third party to any or all physical therapy sessions and can withdraw consent at any point throughout the plan of care.  Patient and guardian (if applicable) verbalize understanding of all above information and consent to pelvic floor assessment and treatment at evaluation and all future physical therapy sessions.     HOME EXERCISE PROGRAM: Patient was provided relevant education topics pertinent to their concerns and issues. Written and/or digital home exercise program and education pages also provided as needed/appropriate.        Impairments: decreased strength, poor bowel/bladder habits, postural weakness, urinary incontinence, poor awareness of body mechanics, impaired motor control and core weakness     Prognosis: excellent prognosis   Personal Factors/Comorbidities: 3+   Specific Comorbidities: h/o spinal cord tumors, OA, anemia, depression/anxiety, heart trouble, motion sickness   Examination of Body Systems: urogenital, gastrointestinal, neurological, musculoskeletal and activity/participation   Clinical Decision Making: moderate   Positive Prognosis Rationale: behavior, motivated for treatment, insight and language.   Clinical Presentation: stable  Therapy Goals     Goals:     Patient Goals: to strengthen pelvic floor to decrease incontinence   Short Term Goals to be reached in 4 weeks:  Pt will demonstrate: 1-Independence with initial HEP for self-management of sxs. 2-Understanding of and need for consistency with behavioral  changes  such as but not limited to appropriate hydration, potential changes to diet, routine diaphragmatic breathing, daily HEP practice, improved defecation mechanics and voiding habits including use of squatty potty for voiding, relaxation exercises, mindfulness activities, and use of physical supports. 3-Levator ani strength >/= 3/5 with >/= 5 second hold followed by complete relaxation for improved pelvic floor muscle strength and coordination, for improved closure of sphincters to progress toward preventing incontinence. 4-Rising no more than 2 times per night to urinate.  Long Term Goals to be reached in 8 weeks:  Pt will demonstrate: 1-Independence with HEP for self-management of sxs. 2-Rising no more than 1 time per night to urinate. 3-Urinary incontinence prevented 90% of time with cough/laugh/sneeze/lift for improved quality of life, skin integrity, and return to prior level of function. 4-Bowel movements classified as type III-IV on Bristol Stool Chart for improved stool consistency and improved ability to have BM without pushing. 5-Levator ani strength >/= 4/5 with >/= 10 second hold followed by complete relaxation for improved pelvic floor muscle strength and coordination, for improved closure of sphincters to prevent incontinence.                Plan   Therapy options: will be seen for skilled physical therapy services   Planned therapy interventions: Manual Therapy, Neuromuscular Re-education, Postural Training, Home Exercise Program, Functional Mobility, Education - Patient, Endurance Activites, Diaphragmatic/Pursed-lip Breathing, Bladder Retraining, Balance Training, Civil engineer, contracting, [id number], [id number] Needling 1-2, 3+ areas, 97010-Cold Packs/Hot Packs, [id number], G0283-Electrical Stimulation (unattended, attended), 97110-Therapeutic Exercises, 97112-Neuromuscular Re-education, 97140-Manual Therapy, 97530-Therapeutic Activities, 97535-Self-Care/Home Training and  97750-Physical Performance Test    Frequency: 1x/wk to 1x/month.   Duration in weeks: 8   Education provided to: patient.   Education provided: Anatomy, Bladder education/retraining, Fluid intake, HEP, Urinary urge inhibition strategies, Treatment options and plan, Symptom management, LA contraction to prevent SU, Education regarding bladder irritants, Diet modifications and Body awareness   Education results: demonstrates understanding, needs further instruction and verbalized good understanding.   Communication/Consultation: Initial note sent to Referring Provider.   Next visit plan:       Progress w core and PFM strengthening and coordination training in seated and standing as tol   Total Session Time: 60   Treatment rendered today:     THERAPEUTIC EXERCISE: 46 Minutes: -PFM pt ed in contraction, and in coordination w breathing -Pelvic Floor Strengthening: Endurance contractions: Hold for  5 seconds /Relax 10 seconds -Supine overflow exercise PFM contraction w hook lying isometric bil hip adduction w pillow squeeze -Hook lying TA contraction w 3 sec holds x 5 -Hook lying bridge w PFM/TA contraction 10 reps w 5 sec holds -Hook lying PFM/TA contraction w isometric hip adduction w pillow squeeze 10 reps w 3 sec holds -Hook lying PFM/TA contraction w bil hand press into mat table and chin tuck 10 reps w 3 sec holds -Seated PFM/TA contraction w isometric bil hip adduction w pillow squeeze 10 reps w 3 sec holds -Seated PFM/TA contraction w manually resisted bil hip abduction 10 reps w 3 sec holds -Seated PFM/TA contraction w bil hand press into anterior thighs 10 reps w 3 sec holds -Standing leaning forward w BUE support on raised mat table, cat/cow 5 reps -Standing leaning forward w BUE support on raised mat table, modified bird dogs w contralateral UE/LE lifts 10 reps -Home Exercise Program for endurance and quick contractions  THERAPEUTIC ACTIVITIES: 13 minutes -Bladder Retraining  -Urge  suppression techniques  -Hydration education/instruction, limiting intake 2 hours prior to  bed -Coordination of PFM/TA activation with ADLs, transfers    Held: -Bladder irritants -pelvic bracing -Supine 90/90 alternate LE extensions 3 reps x 2           Plan details: Therapeutic exercise, therapeutic activities, Neuromuscular Re-education, gt training, work conditioning/Job simulation, and pt education to improve independence and safety with gt, ADLs, and self care. Modalities (heat, ice, ultrasound, electrical stimulation, dry needling) as needed for pain management. Massage, mobilization and manual therapy as needed to increase ROM in restricted soft tissues and joints.    Subjective:   History of Present Condition    History of Present Condition/Chief Complaint:  Pt is a 48 yo female referred to PT for mixed urge and stress incontinence. Subjective:  06/01/24 Pt reports compliance w PFM exercises. Pt notes going to MD this AM for suspected bladder infection and for L knee pain that began about a week ago. Pt reports having a few good days with no incontinence but then a few bad days also. Pt also notes that she may not be drinking quite as much either. Pt reports feeling like she was doing somewhat better with less UI and improved ability to control urge and leaking until symptoms of UTI began a few days ago.  Pt also notes last blood infusion caused a superficial blood clot, treated with icing.  05/19/24 Pt reports she had to go for iron  infusion yesterday Found to have low hemoglobin on annual exam in May. Saw GI doctor then hematologist.  Pt reports having a couple really good days right after last PT appt with minimal leaking, but then has had some really bad days since then with lots of UI. Pt has been very busy and stressed though with infusion appt and son's sleep study this week.   On initial eval: Pt reports UI began over 5 years ago, worsening gradually with drastic worsening  over the past few months. Pt reports feeling her control of bladder function is better in the morning then it decreases as the day goes on. Pt is a Runner, broadcasting/film/video working full time. Pt reports h/o spinal cord tumors at age 5, one surgically removed and one for which she received radiation. Pt notes concern that UI may be due to scar tissue related to this treatment. Pain:    Current pain rating:  0   At best pain rating:  0   At worst pain rating:  2  Location:  Low back occasionally   Quality:  Stiffness   Pain related Behaviors:  None   Red Flags:  None Precautions/Equipment  Precautions:  None and Cancer history   Current Braces/Orthoses:  None   Equipment Currently Used:  None Prior Functional Status    No physical limitations   Current Functional Status:   limited household activities, limited travel, leisure activities, limited exercise, limited recreation and disturbed sleep Social Support:    Lives with:  Spouse   Communication Preference:  Verbal, written and visual Barriers to Learning:  No Barriers Diagnostic Tests:    MRI studies: normal   Treatments:    None   Patient Goals:    Patient/Family goals for therapy:  Decrease/Eliminate UI, improve bladder function, increased strength, improved sleep, return to recreational activites and return to sport/leisure activities   Objective:    Tests    Functional Assessment  Functional Assessment Comments-Pt reports leaking w coughing, sneezing but has started having increased leaking at all times. Pt reports frequent urge to urinate, with leaking frequently on  the way to the bathroom. Pt wears a thick pad now at all times.  Arising 2-4 times per night to urinate. Daily fluid intake: 16 oz water, 8 oz coffee, 8 oz carbonated drink, 16+ oz sweet tea Bowel movements 4-6 per week, Bristol stool scale type  Recently has started drinking less closer to bedtime  Pt reports at least 2-3 leaks per day requiring 2-3 pad changes and  sometimes needing to change her clothes Pt has had irregular periods for many years Pt has not birthed any babies, is occ sexually active with husband without pain or difficulty. Pt reports awareness of PFM and doing frequent PFM exercise, tries to contract to decrease urge to allow getting to the bathroom but notes it has the opposite effect and she pees instead.  Pelvic Floor External exam: Skin integrity normal, no scar, Introitus normal, neurologic normal Tone WNL, no pain w palpation BLEs, gluts, piriformis,  lower abdomen Absent cough reflex Internal Vaginal Exam: Sensation WNL, muscle tone WNL Vaginal muscle strength based on digital palpation: Bulbo: 3/5, LA 2/5 Pt can hold contraction for 2 seconds, 3 times consecutively No pain reported on initial penetration or with palpation during digital exam Coordination w TA: not addressed Coordination w diaphragm: poor initially, good w verbal and tactile cueing   Pelvic Outcome Measures: Incontinence Impact Questionnaire score = 13/21 indicating 62% functional impairment                     I attest that I have reviewed the above information. Signed: Sari GORMAN Minder, PT 06/01/2024 5:02 PM

## 2024-06-01 NOTE — Progress Notes (Signed)
 Attempted to call pt regarding pt's PAT. No answer, left VM

## 2024-06-02 ENCOUNTER — Encounter (HOSPITAL_COMMUNITY): Payer: Self-pay

## 2024-06-02 ENCOUNTER — Other Ambulatory Visit: Payer: Self-pay

## 2024-06-03 ENCOUNTER — Ambulatory Visit (HOSPITAL_COMMUNITY): Admitting: Anesthesiology

## 2024-06-03 ENCOUNTER — Encounter (HOSPITAL_COMMUNITY)
Admission: RE | Disposition: A | Discharge status: Home / Self Care | Payer: Self-pay | Source: Home / Self Care | Attending: Gastroenterology

## 2024-06-03 ENCOUNTER — Encounter (INDEPENDENT_AMBULATORY_CARE_PROVIDER_SITE_OTHER): Payer: Self-pay | Admitting: *Deleted

## 2024-06-03 ENCOUNTER — Ambulatory Visit (HOSPITAL_COMMUNITY)
Admission: RE | Admit: 2024-06-03 | Discharge: 2024-06-03 | Disposition: A | Discharge status: Home / Self Care | Attending: Gastroenterology | Admitting: Gastroenterology

## 2024-06-03 DIAGNOSIS — Z7901 Long term (current) use of anticoagulants: Secondary | ICD-10-CM | POA: Diagnosis not present

## 2024-06-03 DIAGNOSIS — Z791 Long term (current) use of non-steroidal anti-inflammatories (NSAID): Secondary | ICD-10-CM | POA: Diagnosis not present

## 2024-06-03 DIAGNOSIS — E282 Polycystic ovarian syndrome: Secondary | ICD-10-CM | POA: Insufficient documentation

## 2024-06-03 DIAGNOSIS — Z79899 Other long term (current) drug therapy: Secondary | ICD-10-CM | POA: Diagnosis not present

## 2024-06-03 DIAGNOSIS — Z7984 Long term (current) use of oral hypoglycemic drugs: Secondary | ICD-10-CM | POA: Diagnosis not present

## 2024-06-03 DIAGNOSIS — Z923 Personal history of irradiation: Secondary | ICD-10-CM | POA: Insufficient documentation

## 2024-06-03 DIAGNOSIS — K648 Other hemorrhoids: Secondary | ICD-10-CM | POA: Insufficient documentation

## 2024-06-03 DIAGNOSIS — E66813 Obesity, class 3: Secondary | ICD-10-CM | POA: Insufficient documentation

## 2024-06-03 DIAGNOSIS — D509 Iron deficiency anemia, unspecified: Secondary | ICD-10-CM | POA: Diagnosis present

## 2024-06-03 DIAGNOSIS — K5730 Diverticulosis of large intestine without perforation or abscess without bleeding: Secondary | ICD-10-CM | POA: Insufficient documentation

## 2024-06-03 DIAGNOSIS — N926 Irregular menstruation, unspecified: Secondary | ICD-10-CM | POA: Insufficient documentation

## 2024-06-03 DIAGNOSIS — D125 Benign neoplasm of sigmoid colon: Secondary | ICD-10-CM

## 2024-06-03 DIAGNOSIS — K2950 Unspecified chronic gastritis without bleeding: Secondary | ICD-10-CM

## 2024-06-03 DIAGNOSIS — K3189 Other diseases of stomach and duodenum: Secondary | ICD-10-CM | POA: Diagnosis not present

## 2024-06-03 DIAGNOSIS — Z6841 Body Mass Index (BMI) 40.0 and over, adult: Secondary | ICD-10-CM | POA: Insufficient documentation

## 2024-06-03 DIAGNOSIS — K635 Polyp of colon: Secondary | ICD-10-CM | POA: Insufficient documentation

## 2024-06-03 DIAGNOSIS — F17200 Nicotine dependence, unspecified, uncomplicated: Secondary | ICD-10-CM | POA: Diagnosis not present

## 2024-06-03 DIAGNOSIS — K2970 Gastritis, unspecified, without bleeding: Secondary | ICD-10-CM

## 2024-06-03 DIAGNOSIS — K449 Diaphragmatic hernia without obstruction or gangrene: Secondary | ICD-10-CM | POA: Diagnosis not present

## 2024-06-03 DIAGNOSIS — I4891 Unspecified atrial fibrillation: Secondary | ICD-10-CM | POA: Diagnosis not present

## 2024-06-03 HISTORY — PX: COLONOSCOPY: SHX5424

## 2024-06-03 HISTORY — PX: ESOPHAGOGASTRODUODENOSCOPY: SHX5428

## 2024-06-03 LAB — HM COLONOSCOPY

## 2024-06-03 LAB — POCT PREGNANCY, URINE: Preg Test, Ur: NEGATIVE

## 2024-06-03 SURGERY — COLONOSCOPY
Anesthesia: General

## 2024-06-03 MED ORDER — LACTATED RINGERS IV SOLN: INTRAVENOUS | Status: DC

## 2024-06-03 MED ORDER — PANTOPRAZOLE SODIUM 40 MG PO TBEC: 40.0000 mg | DELAYED_RELEASE_TABLET | Freq: Every day | ORAL | 1 refills | Status: DC

## 2024-06-03 MED ORDER — DEXMEDETOMIDINE HCL IN NACL 80 MCG/20ML IV SOLN
INTRAVENOUS | Status: DC | PRN
  Administered 2024-06-03 (×3): 10 ug via INTRAVENOUS

## 2024-06-03 MED ORDER — LIDOCAINE 2% (20 MG/ML) 5 ML SYRINGE
INTRAMUSCULAR | Status: DC | PRN
  Administered 2024-06-03: 100 mg via INTRAVENOUS

## 2024-06-03 MED ORDER — PROPOFOL 500 MG/50ML IV EMUL
INTRAVENOUS | Status: DC | PRN
  Administered 2024-06-03: 150 ug/kg/min via INTRAVENOUS

## 2024-06-03 NOTE — Transfer of Care (Signed)
 Immediate Anesthesia Transfer of Care Note  Patient: Brandi Meyers  Procedure(s) Performed: COLONOSCOPY EGD (ESOPHAGOGASTRODUODENOSCOPY)  Patient Location: Short Stay  Anesthesia Type:MAC  Level of Consciousness: awake, alert , oriented, and patient cooperative  Airway & Oxygen Therapy: Patient Spontanous Breathing  Post-op Assessment: Report given to RN and Post -op Vital signs reviewed and stable  Post vital signs: Reviewed and stable  Last Vitals:  Vitals Value Taken Time  BP 98/61 06/03/2024 09:08  Temp    Pulse 68 06/03/2024 09:08  Resp 16 06/03/2024 09:08  SpO2 98% on RA 06/03/2024 09:08    Last Pain:  Vitals:   06/03/24 0814  TempSrc:   PainSc: 1       Patients Stated Pain Goal: 7 (06/03/24 0706)  Complications: No notable events documented.

## 2024-06-03 NOTE — Interval H&P Note (Signed)
 History and Physical Interval Note:  06/03/2024 7:24 AM  Brandi Meyers Brought  has presented today for surgery, with the diagnosis of IDA,ulcerative colitis.  The various methods of treatment have been discussed with the patient and family. After consideration of risks, benefits and other options for treatment, the patient has consented to  Procedure(s) with comments: COLONOSCOPY (N/A) - 8:30 am, asa 1-2 EGD (ESOPHAGOGASTRODUODENOSCOPY) (N/A) as a surgical intervention.  The patient's history has been reviewed, patient examined, no change in status, stable for surgery.  I have reviewed the patient's chart and labs.  Questions were answered to the patient's satisfaction.     Brandi Meyers Raywood Wailes

## 2024-06-03 NOTE — Anesthesia Postprocedure Evaluation (Signed)
 Anesthesia Post Note  Patient: Brandi Meyers Brought  Procedure(s) Performed: COLONOSCOPY EGD (ESOPHAGOGASTRODUODENOSCOPY)  Patient location during evaluation: PACU Anesthesia Type: General Level of consciousness: awake and alert Pain management: pain level controlled Vital Signs Assessment: post-procedure vital signs reviewed and stable Respiratory status: spontaneous breathing, nonlabored ventilation, respiratory function stable and patient connected to nasal cannula oxygen Cardiovascular status: stable and blood pressure returned to baseline Postop Assessment: no apparent nausea or vomiting Anesthetic complications: no   No notable events documented.   Last Vitals:  Vitals:   06/03/24 0706 06/03/24 0906  BP: 115/63 98/61  Pulse: 65 74  Resp: 17 19  Temp: 36.9 C 36.4 C  SpO2: 97% 98%    Last Pain:  Vitals:   06/03/24 0906  TempSrc: Oral  PainSc: 0-No pain                 Andrea Limes

## 2024-06-03 NOTE — Op Note (Signed)
 [facility] Patient Name: Brandi Meyers Procedure Date: 06/03/2024 8:06 AM MRN: [id number] Date of Birth: 11-08-1975 Attending MD: Deatrice Dine , MD, [id number] CSN: [id number] Age: 48 Admit Type: Outpatient Procedure:                Upper GI endoscopy Indications:              Unexplained iron  deficiency anemia Providers:                Deatrice Dine, MD, Devere Lodge, Jon Loge Referring MD:              Medicines:                Monitored Anesthesia Care Complications:            No immediate complications. Estimated Blood Loss:     Estimated blood loss was minimal. Procedure:                Pre-Anesthesia Assessment:                           - Prior to the procedure, a History and Physical                            was performed, and patient medications and                            allergies were reviewed. The patient's tolerance of                            previous anesthesia was also reviewed. The risks                            and benefits of the procedure and the sedation                            options and risks were discussed with the patient.                            All questions were answered, and informed consent                            was obtained. Prior Anticoagulants: The patient has                            taken no anticoagulant or antiplatelet agents. ASA                            Grade Assessment: III - A patient with severe                            systemic disease. After reviewing the risks and                            benefits, the patient was deemed in satisfactory  condition to undergo the procedure.                           After obtaining informed consent, the endoscope was                            passed under direct vision. Throughout the                            procedure, the patient's blood pressure, pulse, and                            oxygen saturations were monitored continuously. The                             [id number]-[id number] ([id number]) scope was introduced through the                            mouth, and advanced to the second part of duodenum.                            The upper GI endoscopy was accomplished without                            difficulty. The patient tolerated the procedure                            well. Scope In: 8:27:35 AM Scope Out: 8:32:58 AM Total Procedure Duration: 0 hours 5 minutes 23 seconds  Findings:      The examined esophagus was normal.      A 2 cm hiatal hernia was present.      Moderate inflammation characterized by adherent blood, erosions and       erythema was found in the entire examined stomach. Biopsies were taken       with a cold forceps for histology.      The duodenal bulb and second portion of the duodenum were normal.       Biopsies were taken with a cold forceps for histology. Impression:               - Normal esophagus.                           - 2 cm hiatal hernia.                           - Gastritis. Biopsied.                           - Normal duodenal bulb and second portion of the                            duodenum. Biopsied. Moderate Sedation:      Per Anesthesia Care Recommendation:           - Patient has a contact number available for  emergencies. The signs and symptoms of potential                            delayed complications were discussed with the                            patient. Return to normal activities tomorrow.                            Written discharge instructions were provided to the                            patient.                           - Resume previous diet.                           - Continue present medications.                           - Await pathology results.                           - Use Protonix  (pantoprazole ) 40 mg PO daily.                           - No aspirin, ibuprofen, naproxen, or other                            non-steroidal  anti-inflammatory drugs. Procedure Code(s):        --- Professional ---                           [phone], Esophagogastroduodenoscopy, flexible,                            transoral; with biopsy, single or multiple Diagnosis Code(s):        --- Professional ---                           K44.9, Diaphragmatic hernia without obstruction or                            gangrene                           K29.70, Gastritis, unspecified, without bleeding                           D50.9, Iron  deficiency anemia, unspecified CPT copyright 2022 [employer]. All rights reserved. The codes documented in this report are preliminary and upon coder review may  be revised to meet current compliance requirements. Deatrice Dine, MD Deatrice Dine, MD 06/03/2024 8:36:15 AM This report has been signed electronically. Number of Addenda: 0

## 2024-06-03 NOTE — Op Note (Signed)
 [facility] Patient Name: Brandi Meyers Procedure Date: 06/03/2024 8:05 AM MRN: [id number] Date of Birth: 11/10/1975 Attending MD: Deatrice Dine , MD, [id number] CSN: [id number] Age: 48 Admit Type: Outpatient Procedure:                Colonoscopy Indications:              Unexplained iron  deficiency anemia Providers:                Deatrice Dine, MD, Devere Lodge, Jon Loge Referring MD:              Medicines:                Monitored Anesthesia Care Complications:            No immediate complications. Estimated Blood Loss:     Estimated blood loss was minimal. Procedure:                Pre-Anesthesia Assessment:                           - Prior to the procedure, a History and Physical                            was performed, and patient medications and                            allergies were reviewed. The patient's tolerance of                            previous anesthesia was also reviewed. The risks                            and benefits of the procedure and the sedation                            options and risks were discussed with the patient.                            All questions were answered, and informed consent                            was obtained. Prior Anticoagulants: The patient has                            taken no anticoagulant or antiplatelet agents. ASA                            Grade Assessment: III - A patient with severe                            systemic disease. After reviewing the risks and                            benefits, the patient was deemed in satisfactory  condition to undergo the procedure.                           After obtaining informed consent, the colonoscope                            was passed under direct vision. Throughout the                            procedure, the patient's blood pressure, pulse, and                            oxygen saturations were monitored continuously. The                             [id number] scope was introduced through the                            anus and advanced to the the terminal ileum. The                            colonoscopy was performed without difficulty. The                            patient tolerated the procedure well. The quality                            of the bowel preparation was evaluated using the                            BBPS [facility] Bowel Preparation Scale) with scores                            of: Right Colon = 3, Transverse Colon = 3 and Left                            Colon = 3 (entire mucosa seen well with no residual                            staining, small fragments of stool or opaque                            liquid). The total BBPS score equals 9. The                            terminal ileum, ileocecal valve, appendiceal                            orifice, and rectum were photographed. Scope In: 8:37:43 AM Scope Out: 8:57:28 AM Scope Withdrawal Time: 0 hours 16 minutes 35 seconds  Total Procedure Duration: 0 hours 19 minutes 45 seconds  Findings:      The perianal and digital rectal examinations were normal.      A  5 mm polyp was found in the sigmoid colon. The polyp was sessile. The       polyp was removed with a cold snare. Resection and retrieval were       complete.      Multiple medium-mouthed diverticula were found in the left colon.      The terminal ileum appeared normal.      There is no endoscopic evidence of bleeding or ulcerations in the entire       colon. Several biopsies were obtained in the entire colon with cold       forceps for histology.      Non-bleeding internal hemorrhoids were found during retroflexion. The       hemorrhoids were small. Impression:               - One 5 mm polyp in the sigmoid colon, removed with                            a cold snare. Resected and retrieved.                           - Diverticulosis in the left colon.                           -  The examined portion of the ileum was normal.                           - Non-bleeding internal hemorrhoids.                           - Several biopsies were obtained in the entire                            colon. Moderate Sedation:      Per Anesthesia Care Recommendation:           - Patient has a contact number available for                            emergencies. The signs and symptoms of potential                            delayed complications were discussed with the                            patient. Return to normal activities tomorrow.                            Written discharge instructions were provided to the                            patient.                           - Resume previous diet.                           - Continue present medications.                           -  Await pathology results.                           - Repeat colonoscopy in 7-10 years for surveillance.                           - Return to GI office as previously scheduled.                           -Depending on response to iron  supplementation; may                            pursue capsule endoscopy as outpatient Procedure Code(s):        --- Professional ---                           [phone], Colonoscopy, flexible; with removal of                            tumor(s), polyp(s), or other lesion(s) by snare                            technique                           45380, 59, Colonoscopy, flexible; with biopsy,                            single or multiple Diagnosis Code(s):        --- Professional ---                           D12.5, Benign neoplasm of sigmoid colon                           K64.8, Other hemorrhoids                           D50.9, Iron  deficiency anemia, unspecified                           K57.30, Diverticulosis of large intestine without                            perforation or abscess without bleeding CPT copyright 2022 American Medical Association. All rights  reserved. The codes documented in this report are preliminary and upon coder review may  be revised to meet current compliance requirements. Deatrice Dine, MD Deatrice Dine, MD 06/03/2024 9:08:14 AM This report has been signed electronically. Number of Addenda: 0

## 2024-06-03 NOTE — Anesthesia Preprocedure Evaluation (Addendum)
 Anesthesia Evaluation  Patient identified by MRN, date of birth, ID band Patient awake    Reviewed: Allergy & Precautions, H&P , NPO status , Patient's Chart, lab work & pertinent test results  Airway Mallampati: II  TM Distance: >3 FB Neck ROM: Full    Dental no notable dental hx.    Pulmonary Current Smoker   Pulmonary exam normal breath sounds clear to auscultation       Cardiovascular negative cardio ROS Normal cardiovascular exam Rhythm:Regular Rate:Normal     Neuro/Psych negative neurological ROS  negative psych ROS   GI/Hepatic negative GI ROS, Neg liver ROS,,,  Endo/Other    Class 3 obesityMetformin for PCO  Renal/GU negative Renal ROS  negative genitourinary   Musculoskeletal negative musculoskeletal ROS (+)    Abdominal   Peds negative pediatric ROS (+)  Hematology  (+) Blood dyscrasia, anemia   Anesthesia Other Findings   Reproductive/Obstetrics negative OB ROS                              Anesthesia Physical Anesthesia Plan  ASA: 2  Anesthesia Plan: General   Post-op Pain Management:    Induction: Intravenous  PONV Risk Score and Plan:   Airway Management Planned: Nasal Cannula  Additional Equipment:   Intra-op Plan:   Post-operative Plan:   Informed Consent: I have reviewed the patients History and Physical, chart, labs and discussed the procedure including the risks, benefits and alternatives for the proposed anesthesia with the patient or authorized representative who has indicated his/her understanding and acceptance.     Dental advisory given  Plan Discussed with: CRNA  Anesthesia Plan Comments:          Anesthesia Quick Evaluation

## 2024-06-03 NOTE — Discharge Instructions (Signed)

## 2024-06-04 ENCOUNTER — Encounter (HOSPITAL_COMMUNITY): Payer: Self-pay | Admitting: Gastroenterology

## 2024-06-07 LAB — SURGICAL PATHOLOGY

## 2024-06-08 ENCOUNTER — Ambulatory Visit (INDEPENDENT_AMBULATORY_CARE_PROVIDER_SITE_OTHER): Payer: Self-pay | Admitting: Gastroenterology

## 2024-06-08 LAB — GENECONNECT MOLECULAR SCREEN: Genetic Analysis Overall Interpretation: NEGATIVE

## 2024-06-10 NOTE — Progress Notes (Signed)
 10 yr TCS noted in recall Patient result letter mailed procedure note and pathology result faxed to PCP

## 2024-06-25 ENCOUNTER — Encounter: Payer: Self-pay | Admitting: Nurse Practitioner

## 2024-06-25 ENCOUNTER — Ambulatory Visit: Attending: Nurse Practitioner | Admitting: Nurse Practitioner

## 2024-06-25 VITALS — BP 128/70 | HR 68 | Ht 69.5 in | Wt 306.0 lb

## 2024-06-25 DIAGNOSIS — Z720 Tobacco use: Secondary | ICD-10-CM

## 2024-06-25 DIAGNOSIS — I480 Paroxysmal atrial fibrillation: Secondary | ICD-10-CM

## 2024-06-25 DIAGNOSIS — Z136 Encounter for screening for cardiovascular disorders: Secondary | ICD-10-CM | POA: Diagnosis not present

## 2024-06-25 DIAGNOSIS — E785 Hyperlipidemia, unspecified: Secondary | ICD-10-CM

## 2024-06-25 NOTE — Progress Notes (Addendum)
 Cardiology Office Note   Date:  06/25/2024 ID:  Brandi Meyers, DOB [birth year], MRN [id number] PCP: Brought Greig VEAR DEVONNA  [facility] Providers Cardiologist:  Darryle ONEIDA Decent, MD     History of Present Illness Brandi Meyers is a 48 y.o. female with a PMH of chest pain, PAF, hyperlipidemia, tobacco abuse, obesity, OSA, and iron  deficiency anemia, who presents today for overdue follow-up.  Last seen by Dr. Decent on April 26, 2022.  At that time, patient noted occasional chest discomfort.  She also noted occasional/brief A-fib episodes.  Due to CHA2DS2-VASc score of 1, no OAC indicated.  Coronary CTA was arranged.   Today she presents for overdue follow-up.  She states she is doing well since she started receiving iron  transfusions. 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 Enjoys her job very much. 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 Brought in paperwork from [facility]. Denies any chest pain, shortness of breath, palpitations, syncope, presyncope, dizziness, orthopnea, PND, swelling or significant weight changes, acute bleeding, or claudication. Reviewed labs from PCP that showed mildly elevated LDL, rest of labs WNL.    ROS: Negative.  See HPI.  Studies Reviewed  EKG: EKG Interpretation Date/Time:  Friday June 25 2024 15:47:27 EDT Ventricular Rate:  68 PR Interval:  148 QRS Duration:  88 QT Interval:  418 QTC Calculation: 444 R Axis:   68  Text Interpretation: Normal sinus rhythm Low voltage QRS Cannot rule out Anterior infarct , age undetermined No previous ECGs available Confirmed by Miriam Norris [phone]) on 06/25/2024 3:55:16 PM   Echo 12/2020:  1. Left ventricular ejection fraction, by estimation, is 60 to 65%. The  left ventricle has normal function. The left ventricle has no regional  wall motion  abnormalities. Left ventricular diastolic parameters were  normal.   2. Right ventricular systolic function is normal. The right ventricular  size is normal. Tricuspid regurgitation signal is inadequate for assessing  PA pressure.   3. The mitral valve is grossly normal. Trivial mitral valve  regurgitation. No evidence of mitral stenosis.   4. The aortic valve is grossly normal. Aortic valve regurgitation is not  visualized. No aortic stenosis is present.   5. The inferior vena cava is normal in size with greater than 50%  respiratory variability, suggesting right atrial pressure of 3 mmHg.   Conclusion(s)/Recommendation(s): Normal biventricular function without  evidence of hemodynamically significant valvular heart disease.   CT cardiac scoring 12/2020:  IMPRESSION: Coronary calcium  score of 2.12. This was 92nd percentile for age-, race-, and sex-matched controls.  Cardiac monitor 12/2020:  Enrollment 12/05-12/19/2021 (13 days 23 hours). Patient had a min HR of 52 bpm (sinus bradycardia), max HR of 222 bpm (5 second run of SVT), and avg HR of 84 bpm (normal sinus rhythm). Predominant underlying rhythm was Sinus Rhythm. 19 Supraventricular Tachycardia runs occurred, the run with the fastest interval lasting 17 beats (5 second duration) with a max rate of 222 bpm, the longest lasting 13 beats (5.2 seconds) with an avg rate of 147 bpm. SVT episodes appear to represent atrial tachycardia vs. AVNRT. Isolated SVEs were rare (<1.0%), SVE Couplets were rare (<1.0%), and SVE Triplets were rare (<1.0%). Isolated VEs were rare (<1.0%), VE Couplets were rare (<1.0%), and no VE Triplets were present. Ventricular Bigeminy and Trigeminy  were present. Diary summarized below:   10/09/20 08:18am Anxious, Fluttering or racing, Skipped beat(s)/Irregular beats coincided with normal sinus rhythm 83 bpm.    Impression:   1. Brief SVT episodes (19 in 2 weeks; longest 5.2 seconds) that appear to be ectopic atrial  tachycardia vs AVNRT.  2. Symptoms occurred with normal sinus rhythm.  3. Rare ectopy.   Risk Assessment/Calculations     The 10-year ASCVD risk score (Arnett DK, et al., 2019) is: 5.8%   Values used to calculate the score:     Age: 35 years     Clincally relevant sex: Female     Is Non-Hispanic African American: No     Diabetic: No     Tobacco smoker: Yes     Systolic Blood Pressure: 128 mmHg     Is BP treated: No     HDL Cholesterol: 30 mg/dL     Total Cholesterol: 170 mg/dL  Physical Exam VS:  BP 128/70   Pulse 68   Ht 5' 9.5 (1.765 m)   Wt (!) 306 lb (138.8 kg)   SpO2 99%   BMI 44.54 kg/m        Wt Readings from Last 3 Encounters:  06/25/24 (!) 306 lb (138.8 kg)  06/03/24 299 lb 2.6 oz (135.7 kg)  05/31/24 299 lb 2.6 oz (135.7 kg)    GEN: Morbidly obese, 48 y.o. female in no acute distress NECK: No JVD; No carotid bruits CARDIAC: S1/S2, RRR, no murmurs, rubs, gallops RESPIRATORY:  Clear to auscultation without rales, wheezing or rhonchi  ABDOMEN: Soft, non-tender, non-distended EXTREMITIES:  No edema; No deformity   ASSESSMENT AND PLAN  PAF Denies any tachycardia or palpitations.  EKG today confirms she is in normal sinus rhythm, heart rate is well-controlled.  Chest Vascor is 1, she does not require oral anticoagulation at this time.  Continue Toprol -XL.  Discussed avoiding triggers to A-fib. Heart healthy diet and regular cardiovascular exercise encouraged.  HLD Just had labs performed with PCP that were reviewed - LDL was 105.6. Continue Crestor . Discussed lifestyle modifications. Heart healthy diet and regular cardiovascular exercise encouraged.   Tobacco abuse Smoking cessation encouraged and discussed.   Morbid obesity Weight loss via diet and exercise encouraged. Discussed the impact being overweight would have on cardiovascular risk.  I spent a total duration of 20 minutes reviewing prior notes, reviewing outside records including  labs, EKG today,  face-to-face counseling of medical condition, pathophysiology, evaluation, management, and documenting the findings in the note.    Dispo: Follow-up with MD/APP in 1 year or sooner anything changes.  Signed, Almarie Crate, NP

## 2024-06-25 NOTE — Patient Instructions (Addendum)
 Medication Instructions:  Your physician recommends that you continue on your current medications as directed. Please refer to the Current Medication list given to you today.  Labwork: None   Testing/Procedures: None   Follow-Up: Your physician recommends that you schedule a follow-up appointment in: 1 Year   Any Other Special Instructions Will Be Listed Below (If Applicable).  If you need a refill on your cardiac medications before your next appointment, please call your pharmacy.     Mediterranean Diet  Why follow it? Research shows. Those who follow the Mediterranean diet have a reduced risk of heart disease  The diet is associated with a reduced incidence of Parkinson's and Alzheimer's diseases People following the diet may have longer life expectancies and lower rates of chronic diseases  The Dietary Guidelines for Americans recommends the Mediterranean diet as an eating plan to promote health and prevent disease  What Is the Mediterranean Diet?  Healthy eating plan based on typical foods and recipes of Mediterranean-style cooking The diet is primarily a plant based diet; these foods should make up a majority of meals   Starches - Plant based foods should make up a majority of meals - They are an important sources of vitamins, minerals, energy, antioxidants, and fiber - Choose whole grains, foods high in fiber and minimally processed items  - Typical grain sources include wheat, oats, barley, corn, brown rice, bulgar, farro, millet, polenta, couscous  - Various types of beans include chickpeas, lentils, fava beans, black beans, white beans   Fruits  Veggies - Large quantities of antioxidant rich fruits & veggies; 6 or more servings  - Vegetables can be eaten raw or lightly drizzled with oil and cooked  - Vegetables common to the traditional Mediterranean Diet include: artichokes, arugula, beets, broccoli, brussel sprouts, cabbage, carrots, celery, collard greens, cucumbers,  eggplant, kale, leeks, lemons, lettuce, mushrooms, okra, onions, peas, peppers, potatoes, pumpkin, radishes, rutabaga, shallots, spinach, sweet potatoes, turnips, zucchini - Fruits common to the Mediterranean Diet include: apples, apricots, avocados, cherries, clementines, dates, figs, grapefruits, grapes, melons, nectarines, oranges, peaches, pears, pomegranates, strawberries, tangerines  Fats - Replace butter and margarine with healthy oils, such as olive oil, canola oil, and tahini  - Limit nuts to no more than a handful a day  - Nuts include walnuts, almonds, pecans, pistachios, pine nuts  - Limit or avoid candied, honey roasted or heavily salted nuts - Olives are central to the [employer] - can be eaten whole or used in a variety of dishes   Meats Protein - Limiting red meat: no more than a few times a month - When eating red meat: choose lean cuts and keep the portion to the size of deck of cards - Eggs: approx. 0 to 4 times a week  - Fish and lean poultry: at least 2 a week  - Healthy protein sources include, chicken, [place], lean beef, lamb - Increase intake of seafood such as tuna, salmon, trout, mackerel, shrimp, scallops - Avoid or limit high fat processed meats such as sausage and bacon  Dairy - Include moderate amounts of low fat dairy products  - Focus on healthy dairy such as fat free yogurt, skim milk, low or reduced fat cheese - Limit dairy products higher in fat such as whole or 2% milk, cheese, ice cream  Alcohol - Moderate amounts of red wine is ok  - No more than 5 oz daily for women (all ages) and men older than age 56  - No more  than 10 oz of wine daily for men younger than 84  Other - Limit sweets and other desserts  - Use herbs and spices instead of salt to flavor foods  - Herbs and spices common to the traditional Mediterranean Diet include: basil, bay leaves, chives, cloves, cumin, fennel, garlic, lavender, marjoram, mint, oregano, parsley, pepper, rosemary,  sage, savory, sumac, tarragon, thyme   It's not just a diet, it's a lifestyle:  The Mediterranean diet includes lifestyle factors typical of those in the region  Foods, drinks and meals are best eaten with others and savored Daily physical activity is important for overall good health This could be strenuous exercise like running and aerobics This could also be more leisurely activities such as walking, housework, yard-work, or taking the stairs Moderation is the key; a balanced and healthy diet accommodates most foods and drinks Consider portion sizes and frequency of consumption of certain foods   Meal Ideas & Options:  Breakfast:  Whole wheat toast or whole wheat English muffins with peanut butter & hard boiled egg Steel cut oats topped with apples & cinnamon and skim milk  Fresh fruit: banana, strawberries, melon, berries, peaches  Smoothies: strawberries, bananas, greek yogurt, peanut butter Low fat greek yogurt with blueberries and granola  Egg white omelet with spinach and mushrooms Breakfast couscous: whole wheat couscous, apricots, skim milk, cranberries  Sandwiches:  Hummus and grilled vegetables (peppers, zucchini, squash) on whole wheat bread   Grilled chicken on whole wheat pita with lettuce, tomatoes, cucumbers or tzatziki  [place] salad on whole wheat bread: tuna salad made with greek yogurt, olives, red peppers, capers, green onions Garlic rosemary lamb pita: lamb sauted with garlic, rosemary, salt & pepper; add lettuce, cucumber, greek yogurt to pita - flavor with lemon juice and black pepper  Seafood:  Mediterranean grilled salmon, seasoned with garlic, basil, parsley, lemon juice and black pepper Shrimp, lemon, and spinach whole-grain pasta salad made with low fat greek yogurt  Seared scallops with lemon orzo  Seared tuna steaks seasoned salt, pepper, coriander topped with tomato mixture of olives, tomatoes, olive oil, minced garlic, parsley, green onions and cappers   Meats:  Herbed greek chicken salad with kalamata olives, cucumber, feta  Red bell peppers stuffed with spinach, bulgur, lean ground beef (or lentils) & topped with feta   Kebabs: skewers of chicken, tomatoes, onions, zucchini, squash  [place] burgers: made with red onions, mint, dill, lemon juice, feta cheese topped with roasted red peppers Vegetarian Cucumber salad: cucumbers, artichoke hearts, celery, red onion, feta cheese, tossed in olive oil & lemon juice  Hummus and whole grain pita points with a greek salad (lettuce, tomato, feta, olives, cucumbers, red onion) Lentil soup with celery, carrots made with vegetable broth, garlic, salt and pepper  Tabouli salad: parsley, bulgur, mint, scallions, cucumbers, tomato, radishes, lemon juice, olive oil, salt and pepper.

## 2024-06-26 ENCOUNTER — Other Ambulatory Visit (INDEPENDENT_AMBULATORY_CARE_PROVIDER_SITE_OTHER): Payer: Self-pay | Admitting: Gastroenterology

## 2024-07-06 ENCOUNTER — Other Ambulatory Visit: Payer: Self-pay

## 2024-07-06 ENCOUNTER — Other Ambulatory Visit: Payer: Self-pay | Admitting: *Deleted

## 2024-07-06 DIAGNOSIS — D500 Iron deficiency anemia secondary to blood loss (chronic): Secondary | ICD-10-CM

## 2024-07-07 ENCOUNTER — Inpatient Hospital Stay: Attending: Oncology

## 2024-07-07 DIAGNOSIS — D509 Iron deficiency anemia, unspecified: Secondary | ICD-10-CM | POA: Diagnosis present

## 2024-07-07 DIAGNOSIS — D500 Iron deficiency anemia secondary to blood loss (chronic): Secondary | ICD-10-CM

## 2024-07-07 LAB — CBC WITH DIFFERENTIAL/PLATELET
Abs Immature Granulocytes: 0.02 K/uL (ref 0.00–0.07)
Basophils Absolute: 0 K/uL (ref 0.0–0.1)
Basophils Relative: 0 %
Eosinophils Absolute: 0.3 K/uL (ref 0.0–0.5)
Eosinophils Relative: 5 %
HCT: 41.5 % (ref 36.0–46.0)
Hemoglobin: 13.5 g/dL (ref 12.0–15.0)
Immature Granulocytes: 0 %
Lymphocytes Relative: 22 %
Lymphs Abs: 1.5 K/uL (ref 0.7–4.0)
MCH: 26.4 pg (ref 26.0–34.0)
MCHC: 32.5 g/dL (ref 30.0–36.0)
MCV: 81.1 fL (ref 80.0–100.0)
Monocytes Absolute: 0.5 K/uL (ref 0.1–1.0)
Monocytes Relative: 8 %
Neutro Abs: 4.3 K/uL (ref 1.7–7.7)
Neutrophils Relative %: 65 %
Platelets: 216 K/uL (ref 150–400)
RBC: 5.12 MIL/uL — ABNORMAL HIGH (ref 3.87–5.11)
RDW: 26.6 % — ABNORMAL HIGH (ref 11.5–15.5)
Smear Review: NORMAL
WBC: 6.7 K/uL (ref 4.0–10.5)
nRBC: 0 % (ref 0.0–0.2)

## 2024-07-07 LAB — IRON AND TIBC
Iron: 41 ug/dL (ref 28–170)
Saturation Ratios: 11 % (ref 10.4–31.8)
TIBC: 361 ug/dL (ref 250–450)
UIBC: 320 ug/dL

## 2024-07-07 LAB — FERRITIN: Ferritin: 19 ng/mL (ref 11–307)

## 2024-07-13 NOTE — Progress Notes (Deleted)
  [facility] at [facility]  HEMATOLOGY FOLLOW-UP VISIT  Jolee, [name], [name]  REASON FOR FOLLOW-UP: Iron  deficiency anemia  ASSESSMENT & PLAN:  Patient is a 48 y.o. female following for iron  deficiency anemia  Assessment & Plan     No orders of the defined types were placed in this encounter.   The total time spent in the appointment was {CHL ONC TIME VISIT - DTPQU:[id number]} encounter with patients including review of chart and various tests results, discussions about plan of care and coordination of care plan   All questions were answered. The patient knows to call the clinic with any problems, questions or concerns. No barriers to learning was detected.  Mickiel Dry, MD 9/16/202510:37 PM   SUMMARY OF HEMATOLOGIC HISTORY: ***   INTERVAL HISTORY: Brandi Meyers 48 y.o. female following for iron  deficiency anemia. Patient was accompanied by    I have reviewed the past medical history, past surgical history, social history and family history with the patient   ALLERGIES:  is allergic to latex.  MEDICATIONS:  Current Outpatient Medications  Medication Sig Dispense Refill   albuterol (VENTOLIN HFA) 108 (90 Base) MCG/ACT inhaler TAKE 2 PUFFS BY MOUTH 4 TIMES A DAY     b complex vitamins capsule Take by mouth.     Cholecalciferol 25 MCG (1000 UT) tablet Take by mouth.     clotrimazole-betamethasone (LOTRISONE) cream as needed.     DULoxetine (CYMBALTA) 30 MG capsule TAKE 1 CAPSULE BY MOUTH EVERY DAY WITH 60MG  DOSE     DULoxetine (CYMBALTA) 60 MG capsule Take 60 mg by mouth every morning.     metFORMIN (GLUCOPHAGE-XR) 750 MG 24 hr tablet Take 2 tablets by mouth every morning.     metoprolol  succinate (TOPROL -XL) 25 MG 24 hr tablet TAKE 1 TABLET (25 MG TOTAL) BY MOUTH DAILY. 90 tablet 0   Multiple Vitamin (MULTIVITAMIN WITH MINERALS) TABS tablet Take 1 tablet by mouth daily.     pantoprazole  (PROTONIX ) 40 MG tablet TAKE 1 TABLET BY MOUTH EVERY DAY 90  tablet 1   rosuvastatin  (CRESTOR ) 20 MG tablet Take 1 tablet (20 mg total) by mouth daily. NEED OV. 90 tablet 0   triamcinolone (KENALOG) 0.1 % APPLY TO AFFECTED AREA TWICE A DAY     Zinc 50 MG TABS Take by mouth.     No current facility-administered medications for this visit.     REVIEW OF SYSTEMS:   Constitutional: Denies fevers, chills or night sweats Eyes: Denies blurriness of vision Ears, nose, mouth, throat, and face: Denies mucositis or sore throat Respiratory: Denies cough, dyspnea or wheezes Cardiovascular: Denies palpitation, chest discomfort or lower extremity swelling Gastrointestinal:  Denies nausea, heartburn or change in bowel habits Skin: Denies abnormal skin rashes Lymphatics: Denies new lymphadenopathy or easy bruising Neurological:Denies numbness, tingling or new weaknesses Behavioral/Psych: Mood is stable, no new changes  All other systems were reviewed with the patient and are negative.  PHYSICAL EXAMINATION:   There were no vitals filed for this visit.  GENERAL:alert, no distress and comfortable SKIN: skin color, texture, turgor are normal, no rashes or significant lesions LYMPH:  no palpable lymphadenopathy in the cervical, axillary or inguinal LUNGS: clear to auscultation and percussion with normal breathing effort HEART: regular rate & rhythm and no murmurs and no lower extremity edema ABDOMEN:abdomen soft, non-tender and normal bowel sounds Musculoskeletal:no cyanosis of digits and no clubbing  NEURO: alert & oriented x 3 with fluent speech  LABORATORY DATA:  I have reviewed the data as listed  Lab Results  Component Value Date   WBC 6.7 07/07/2024   NEUTROABS 4.3 07/07/2024   HGB 13.5 07/07/2024   HCT 41.5 07/07/2024   MCV 81.1 07/07/2024   PLT 216 07/07/2024       Chemistry      Component Value Date/Time   NA 139 05/14/2024 1131   NA 141 04/26/2022 1031   K 4.2 05/14/2024 1131   CL 104 05/14/2024 1131   CO2 22 05/14/2024 1131   BUN  9 05/14/2024 1131   BUN 7 04/26/2022 1031   CREATININE 0.54 05/14/2024 1131      Component Value Date/Time   CALCIUM  9.3 05/14/2024 1131   ALKPHOS 98 05/14/2024 1131   AST 19 05/14/2024 1131   ALT 22 05/14/2024 1131   BILITOT <0.2 05/14/2024 1131      Latest Reference Range & Units 07/07/24 15:37  Iron  28 - 170 ug/dL 41  UIBC ug/dL 679  TIBC 749 - 549 ug/dL 638  Saturation Ratios 10.4 - 31.8 % 11  Ferritin 11 - 307 ng/mL 19     RADIOGRAPHIC STUDIES: I have personally reviewed the radiological images as listed and agreed with the findings in the report.  US  Venous Img Upper Uni Left CLINICAL DATA:  Swollen, painful left antecubital vein.  EXAM: LEFT UPPER EXTREMITY VENOUS DOPPLER ULTRASOUND  TECHNIQUE: Gray-scale sonography with graded compression, as well as color Doppler and duplex ultrasound were performed to evaluate the upper extremity deep venous system from the level of the subclavian vein and including the jugular, axillary, basilic, radial, ulnar and upper cephalic vein. Spectral Doppler was utilized to evaluate flow at rest and with distal augmentation maneuvers.  COMPARISON:  None Available.  FINDINGS: Contralateral Subclavian Vein: Respiratory phasicity is normal and symmetric with the symptomatic side. No evidence of thrombus. Normal compressibility.  Internal Jugular Vein: No evidence of thrombus. Normal compressibility, respiratory phasicity and response to augmentation.  Subclavian Vein: No evidence of thrombus. Normal compressibility, respiratory phasicity and response to augmentation.  Axillary Vein: No evidence of thrombus. Normal compressibility, respiratory phasicity and response to augmentation.  Cephalic Vein: There is expansion of the lumen of the cephalic vein with echogenic areas. There is no internal vascularity. No compressibility, respiratory phasicity and response to augmentation. Findings favor acute superficial vein thrombosis.  The thrombus also extends into the median cubital vein in the antecubital fossa.  Basilic Vein: No evidence of thrombus. Normal compressibility, respiratory phasicity and response to augmentation.  Brachial Veins: No evidence of thrombus. Normal compressibility, respiratory phasicity and response to augmentation.  Radial Veins: No evidence of thrombus. Normal compressibility, respiratory phasicity and response to augmentation.  Ulnar Veins: No evidence of thrombus. Normal compressibility, respiratory phasicity and response to augmentation.  Venous Reflux:  None visualized.  Other Findings:  None visualized.  IMPRESSION: 1. No evidence of DVT within the left upper extremity. 2. Acute superficial vein thrombosis involving the left cephalic vein and median cubital vein.  These results will be called to the ordering clinician or representative by the Radiologist Assistant, and communication documented in the PACS or [employer].  Electronically Signed   By: Ree Molt M.D.   On: 05/31/2024 13:01

## 2024-07-14 ENCOUNTER — Inpatient Hospital Stay: Admitting: Oncology

## 2024-07-21 ENCOUNTER — Ambulatory Visit: Admitting: Oncology

## 2024-07-22 ENCOUNTER — Ambulatory Visit: Admitting: Orthopedic Surgery

## 2024-07-27 ENCOUNTER — Inpatient Hospital Stay (HOSPITAL_BASED_OUTPATIENT_CLINIC_OR_DEPARTMENT_OTHER): Admitting: Oncology

## 2024-07-27 VITALS — BP 143/83 | HR 70 | Temp 98.3°F | Resp 18 | Ht 70.5 in | Wt 300.0 lb

## 2024-07-27 DIAGNOSIS — D500 Iron deficiency anemia secondary to blood loss (chronic): Secondary | ICD-10-CM

## 2024-07-27 DIAGNOSIS — D509 Iron deficiency anemia, unspecified: Secondary | ICD-10-CM | POA: Diagnosis not present

## 2024-07-27 NOTE — Progress Notes (Unsigned)
 [facility] OFFICE PROGRESS NOTE  [name], [name]  ASSESSMENT & PLAN:    Assessment & Plan Iron  deficiency anemia due to chronic blood loss The most likely cause of her anemia is due to chronic blood loss or malabsorption syndrome. Colonoscopy/EGD on 06/03/2024 showed 5 mm polyp in sigmoid colon, diverticulosis and nonbleeding internal hemorrhoid.  EGD showed normal esophagus with a 2 cm hiatal hernia and gastritis.  Pathology was negative for metaplasia or dysplasia. She received 2 doses of IV Venofer  on 05/18/2024 and 05/25/2024 with good tolerance. Repeat labs showed improvement of her hemoglobin after iron  infusion. OTC iron  tablets versus 1 additional dose of IV iron  based on patient tolerance. Patient was agreeable to 1 dose of 500 mg IV Venofer  with repeat labs in 3 months.  She continues to deny any bright red blood per rectum, melena or hematochezia.   Orders Placed This Encounter  Procedures   CBC with Differential    Standing Status:   Future    Expected Date:   10/26/2024    Expiration Date:   01/24/2025   Ferritin    Standing Status:   Future    Expected Date:   10/26/2024    Expiration Date:   01/24/2025   Iron  and TIBC ([facility] ONLY)    Standing Status:   Future    Expected Date:   10/26/2024    Expiration Date:   01/24/2025    INTERVAL HISTORY: Patient returns for follow-up.,  Patient had colonoscopy/EGD on 06/03/2024 which showed 1 5 mm polyp in sigmoid colon, diverticulosis and nonbleeding internal hemorrhoids.  EGD showed normal esophagus with a 2 cm hiatal hernia and gastritis.  Pathology was negative.  She received 2 doses of IV Venofer  on 05/18/2024 and 05/25/2024 with great tolerance.  Patient reports feeling exhausted and needing a nap following her first IV iron  infusion she did not feel as bad after her second infusion but still pretty tired.  She has not noticed any increase in energy levels since her IV iron  but reports she  did not feel bad to begin with.  Reports she is taking oral iron  tablets daily without significant issue.  Appetite is percent energy levels are 75%.  Reports she developed a superficial blood clot in her left AC after her iron  infusion.  Reports she noticed some swelling and pain a few days after her infusion and came to clinic to have it looked at.  Of her left upper extremity showed a superficial thrombus involving the left cephalic vein and median cubital vein.  Symptoms eventually resolved on their own.  Reports she is no longer craving ice.  We reviewed iron  panel, ferritin, CBC and CMP.  SUMMARY OF HEMATOLOGIC HISTORY: Oncology History   No history exists.     CBC    Component Value Date/Time   WBC 6.7 07/07/2024 1538   RBC 5.12 (H) 07/07/2024 1538   HGB 13.5 07/07/2024 1538   HCT 41.5 07/07/2024 1538   PLT 216 07/07/2024 1538   MCV 81.1 07/07/2024 1538   MCH 26.4 07/07/2024 1538   MCHC 32.5 07/07/2024 1538   RDW 26.6 (H) 07/07/2024 1538   LYMPHSABS 1.5 07/07/2024 1538   MONOABS 0.5 07/07/2024 1538   EOSABS 0.3 07/07/2024 1538   BASOSABS 0.0 07/07/2024 1538       Latest Ref Rng & Units 05/14/2024   11:31 AM 04/26/2022   10:31 AM 08/17/2021    4:45 PM  CMP  Glucose 70 -  99 mg/dL 89  93  89   BUN 6 - 20 mg/dL 9  7  6    Creatinine 0.44 - 1.00 mg/dL 9.45  9.43  9.37   Sodium 135 - 145 mmol/L 139  141  142   Potassium 3.5 - 5.1 mmol/L 4.2  4.5  4.5   Chloride 98 - 111 mmol/L 104  104  104   CO2 22 - 32 mmol/L 22  26  24    Calcium  8.9 - 10.3 mg/dL 9.3  9.8  9.4   Total Protein 6.5 - 8.1 g/dL 7.3     Total Bilirubin 0.0 - 1.2 mg/dL <9.7     Alkaline Phos 38 - 126 U/L 98     AST 15 - 41 U/L 19     ALT 0 - 44 U/L 22        Lab Results  Component Value Date   FERRITIN 19 07/07/2024   VITAMINB12 526 05/14/2024    There were no vitals filed for this visit.  Review of System:  Review of Systems  Constitutional:  Positive for malaise/fatigue.  Cardiovascular:   Positive for palpitations.  Gastrointestinal:  Positive for diarrhea. Negative for blood in stool and melena.  Neurological:  Positive for dizziness, tingling and sensory change.    Physical Exam: Physical Exam Constitutional:      Appearance: Normal appearance.  HENT:     Head: Normocephalic and atraumatic.  Eyes:     Pupils: Pupils are equal, round, and reactive to light.  Cardiovascular:     Rate and Rhythm: Normal rate and regular rhythm.     Heart sounds: Normal heart sounds. No murmur heard. Pulmonary:     Effort: Pulmonary effort is normal.     Breath sounds: Normal breath sounds. No wheezing.  Abdominal:     General: Bowel sounds are normal. There is no distension.     Palpations: Abdomen is soft.     Tenderness: There is no abdominal tenderness.  Musculoskeletal:        General: Normal range of motion.     Cervical back: Normal range of motion.  Skin:    General: Skin is warm and dry.     Findings: No rash.  Neurological:     Mental Status: She is alert and oriented to person, place, and time.     Gait: Gait is intact.  Psychiatric:        Mood and Affect: Mood and affect normal.        Cognition and Memory: Memory normal.        Judgment: Judgment normal.      I spent 25 minutes dedicated to the care of this patient (face-to-face and non-face-to-face) on the date of the encounter to include what is described in the assessment and plan.,  [name], NP 07/27/2024 1:04 PM

## 2024-07-27 NOTE — Assessment & Plan Note (Addendum)
 The most likely cause of her anemia is due to chronic blood loss or malabsorption syndrome. Colonoscopy/EGD on 06/03/2024 showed 5 mm polyp in sigmoid colon, diverticulosis and nonbleeding internal hemorrhoid.  EGD showed normal esophagus with a 2 cm hiatal hernia and gastritis.  Pathology was negative for metaplasia or dysplasia. She received 2 doses of IV Venofer  on 05/18/2024 and 05/25/2024 with good tolerance. Repeat labs showed improvement of her hemoglobin after iron  infusion. OTC iron  tablets versus 1 additional dose of IV iron  based on patient tolerance. Patient was agreeable to 1 dose of 500 mg IV Venofer  with repeat labs in 3 months.  She continues to deny any bright red blood per rectum, melena or hematochezia.

## 2024-07-28 ENCOUNTER — Encounter: Payer: Self-pay | Admitting: Oncology

## 2024-07-29 ENCOUNTER — Other Ambulatory Visit (INDEPENDENT_AMBULATORY_CARE_PROVIDER_SITE_OTHER): Payer: Self-pay

## 2024-07-29 ENCOUNTER — Encounter: Payer: Self-pay | Admitting: Orthopedic Surgery

## 2024-07-29 ENCOUNTER — Ambulatory Visit: Admitting: Orthopedic Surgery

## 2024-07-29 VITALS — BP 139/85 | HR 73 | Ht 71.0 in | Wt 300.0 lb

## 2024-07-29 DIAGNOSIS — M1712 Unilateral primary osteoarthritis, left knee: Secondary | ICD-10-CM | POA: Diagnosis not present

## 2024-07-29 DIAGNOSIS — Z6841 Body Mass Index (BMI) 40.0 and over, adult: Secondary | ICD-10-CM

## 2024-07-29 DIAGNOSIS — M25462 Effusion, left knee: Secondary | ICD-10-CM

## 2024-07-29 DIAGNOSIS — G8929 Other chronic pain: Secondary | ICD-10-CM

## 2024-07-29 MED ORDER — METHYLPREDNISOLONE ACETATE 40 MG/ML IJ SUSP
40.0000 mg | Freq: Once | INTRAMUSCULAR | Status: AC
  Administered 2024-07-29: 40 mg via INTRA_ARTICULAR

## 2024-07-29 NOTE — Progress Notes (Signed)
 Office Visit Note   Patient: Brandi Meyers           Date of Birth: 10-06-1976           MRN: [id number] Visit Date: 07/29/2024 Requested by: Meyers Greig DEL, [name] [facility] [address] [place],  [place] [address] PCP: Meyers Greig DEL, [name]   Assessment & Plan:   Encounter Diagnoses  Name Primary?   Chronic pain of left knee Yes   Body mass index 40.0-44.9, adult (HCC)    Primary osteoarthritis of left knee    Effusion of knee joint, left     Meds ordered this encounter  Medications   methylPREDNISolone acetate (DEPO-MEDROL) injection 40 mg    Female with BMI of 41.88 recurrent effusions left knee with osteoarthritis  At this point she has gastritis recently had endoscopy she is undergoing iron  infusions so there is concern about NSAID use.  She does take some ibuprofen on occasion just when she is just absolutely in a lot of pain  This limits our options in terms of treatment  I would wait about 6 weeks after her endoscopy and on proton pump inhibitor before trying Celebrex  Another option is a short course of prednisone  We did aspirate and inject the knee today.  Follow-up in 2 weeks for possible repeat aspiration injection or starting NSAID Celebrex or prednisone   Subjective: Chief Complaint  Patient presents with   Knee Pain    Left     HPI: Brandi Meyers presents to us  with recurrent pain and effusions of the left knee status post aspiration injection with recurrent effusion and continued pain.  She has osteoarthritis of the left knee she was evaluated for possible meniscus tear as well  She does have gastritis had a recent endoscopy and was noted to need iron  infusions  She was told not to take NSAIDs unless absolutely necessary              ROS: Thing to add   Images personally read and my interpretation : New images were obtained  Visit Diagnoses:  1. Chronic pain of left knee   2. Body mass index 40.0-44.9, adult (HCC)   3. Primary osteoarthritis of left  knee   4. Effusion of knee joint, left      Follow-Up Instructions: Return in about 2 weeks (around 08/12/2024) for FOLLOW UP, LEFT, KNEE.    Objective: Vital Signs: BP 139/85   Pulse 73   Ht 5' 11 (1.803 m)   Wt 300 lb (136.1 kg)   BMI 41.84 kg/m   Physical Exam Appearance normal orientation x 3 normal mood affect normal gait station normal  Left knee effusion large.  Range of motion limited.  She has full extension and 115 degrees of flexion.  The knee is stable to strength is normal the muscle tone is excellent skin is intact no erythema.  Good distal pulses no edema normal sensation  Right knee range of motion is approximately 120 degrees but I detect a flexion contracture of 5 degrees.  Ortho Exam As noted above  Specialty Comments:  No specialty comments available.  Imaging: DG Knee AP/LAT W/Sunrise Left Result Date: 07/29/2024 X-rays left knee for pain and effusion X-rays reveal Alignment valgus Joint space narrowing primarily medial Secondary bone changes osteophytes Impression moderate OA     PMFS History: Patient Active Problem List   Diagnosis Date Noted   Gastritis and gastroduodenitis 06/03/2024   Diverticulosis of colon  without hemorrhage 06/03/2024   Adenomatous polyp of sigmoid colon 06/03/2024   Zinc excess 05/14/2024   Iron  deficiency anemia due to chronic blood loss 05/10/2024   BMI 40.0-44.9, adult (HCC) 05/10/2024   Mixed stress and urge urinary incontinence 04/28/2024   Persistent depressive disorder 04/28/2024   Smoking addiction 04/28/2024   SVT (supraventricular tachycardia) 04/28/2024   Closed displaced fracture of neck of right fifth metacarpal bone with delayed healing 05/09/2023   Past Medical History:  Diagnosis Date   A-fib [facility])    Spinal cord tumor     Family History  Problem Relation Age of Onset   Heart failure Mother    Arrhythmia Mother    Stroke Father    Heart attack Maternal Grandmother     Past Surgical History:   Procedure Laterality Date   CHOLECYSTECTOMY     COLONOSCOPY N/A 06/03/2024   Procedure: COLONOSCOPY;  Surgeon: Cinderella Deatrice FALCON, MD;  [facility];  Service: Endoscopy;  Laterality: N/A;  8:30 am, asa 1-2   ESOPHAGOGASTRODUODENOSCOPY N/A 06/03/2024   Procedure: EGD (ESOPHAGOGASTRODUODENOSCOPY);  Surgeon: Cinderella Deatrice FALCON, MD;  Location: [facility];  Service: Endoscopy;  Laterality: N/A;   KNEE SURGERY     spinal tumor surgery     Social History   Occupational History   Occupation: teahcer's assistannt  Tobacco Use   Smoking status: Every Day    Current packs/day: 1.00    Average packs/day: 1 pack/day for 25.0 years (25.0 ttl pk-yrs)    Types: Cigarettes   Smokeless tobacco: Never  Substance and Sexual Activity   Alcohol use: Never   Drug use: Never   Sexual activity: Not on file    Procedure note injection and aspiration left knee joint  Verbal consent was obtained to aspirate and inject the left knee joint   Timeout was completed to confirm the site of aspiration and injection  An 18-gauge needle was used to aspirate the left knee joint from a suprapatellar lateral approach.  The medications used were 40 mg of Depo-Medrol and 1% lidocaine  3 cc  Anesthesia was provided by ethyl chloride and the skin was prepped with alcohol.  After cleaning the skin with alcohol an 18-gauge needle was used to aspirate the right knee joint.  We obtained 25 cc of fluid CLR YELLOW   We followed this by injection of 40 mg of Depo-Medrol and 3 cc 1% lidocaine .  There were no complications. A sterile bandage was applied.

## 2024-07-29 NOTE — Progress Notes (Signed)
  Intake history:  Chief Complaint  Patient presents with   Knee Pain    Left      BP 139/85   Pulse 73   Ht 5' 11 (1.803 m)   Wt 300 lb (136.1 kg)   BMI 41.84 kg/m  Body mass index is 41.84 kg/m.  Pharmacy? _______[facility]_______________  WHAT ARE WE SEEING YOU FOR TODAY?   Left knee  How long has this bothered you? (DOI?DOS?WS?)  Since July no recent injury   Was there an injury? No  Anticoag.  No  Diabetes No  Heart disease Yes/ AFIB   Hypertension No  SMOKING HX Yes  Kidney disease No  Any ALLERGIES ____________ Allergies  Allergen Reactions   Latex Dermatitis   _________________________________   Treatment:  Have you taken:  Tylenol  No  Advil Yes  Had PT No  Had injection Yes / aspiration injection   Other  _________________________

## 2024-07-30 ENCOUNTER — Encounter (INDEPENDENT_AMBULATORY_CARE_PROVIDER_SITE_OTHER): Payer: Self-pay | Admitting: Gastroenterology

## 2024-08-06 ENCOUNTER — Inpatient Hospital Stay: Attending: Oncology

## 2024-08-06 VITALS — BP 127/57 | HR 76 | Temp 97.8°F | Resp 18

## 2024-08-06 DIAGNOSIS — D509 Iron deficiency anemia, unspecified: Secondary | ICD-10-CM | POA: Diagnosis present

## 2024-08-06 DIAGNOSIS — D500 Iron deficiency anemia secondary to blood loss (chronic): Secondary | ICD-10-CM

## 2024-08-06 MED ORDER — SODIUM CHLORIDE 0.9 % IV SOLN: INTRAVENOUS | Status: DC

## 2024-08-06 MED ORDER — CETIRIZINE HCL 10 MG PO TABS
10.0000 mg | ORAL_TABLET | Freq: Once | ORAL | Status: AC
  Administered 2024-08-06: 10 mg via ORAL
  Filled 2024-08-06: qty 1

## 2024-08-06 MED ORDER — SODIUM CHLORIDE 0.9 % IV SOLN
500.0000 mg | Freq: Once | INTRAVENOUS | Status: AC
  Administered 2024-08-06: 500 mg via INTRAVENOUS
  Filled 2024-08-06: qty 25

## 2024-08-06 MED ORDER — ACETAMINOPHEN 325 MG PO TABS
650.0000 mg | ORAL_TABLET | Freq: Once | ORAL | Status: AC
  Administered 2024-08-06: 650 mg via ORAL
  Filled 2024-08-06: qty 2

## 2024-08-06 NOTE — Patient Instructions (Signed)

## 2024-08-06 NOTE — Progress Notes (Signed)
Treatment given today per MD orders.  Tolerated infusion without adverse affects.  Vital signs stable.  No complaints at this time.  Discharge from clinic ambulatory in stable condition.  Alert and oriented X 3.  Follow up with [facility] as scheduled.

## 2024-08-19 ENCOUNTER — Ambulatory Visit: Admitting: Orthopedic Surgery

## 2024-11-09 ENCOUNTER — Inpatient Hospital Stay: Attending: Oncology

## 2024-11-09 DIAGNOSIS — D500 Iron deficiency anemia secondary to blood loss (chronic): Secondary | ICD-10-CM

## 2024-11-09 LAB — CBC WITH DIFFERENTIAL/PLATELET
Abs Immature Granulocytes: 0.03 K/uL (ref 0.00–0.07)
Basophils Absolute: 0 K/uL (ref 0.0–0.1)
Basophils Relative: 0 %
Eosinophils Absolute: 0.5 K/uL (ref 0.0–0.5)
Eosinophils Relative: 7 %
HCT: 42.2 % (ref 36.0–46.0)
Hemoglobin: 14.5 g/dL (ref 12.0–15.0)
Immature Granulocytes: 0 %
Lymphocytes Relative: 22 %
Lymphs Abs: 1.5 K/uL (ref 0.7–4.0)
MCH: 31.8 pg (ref 26.0–34.0)
MCHC: 34.4 g/dL (ref 30.0–36.0)
MCV: 92.5 fL (ref 80.0–100.0)
Monocytes Absolute: 0.5 K/uL (ref 0.1–1.0)
Monocytes Relative: 7 %
Neutro Abs: 4.3 K/uL (ref 1.7–7.7)
Neutrophils Relative %: 64 %
Platelets: 209 K/uL (ref 150–400)
RBC: 4.56 MIL/uL (ref 3.87–5.11)
RDW: 14.5 % (ref 11.5–15.5)
WBC: 6.8 K/uL (ref 4.0–10.5)
nRBC: 0 % (ref 0.0–0.2)

## 2024-11-09 LAB — FERRITIN: Ferritin: 28 ng/mL (ref 11–307)

## 2024-11-09 LAB — IRON AND TIBC
Iron: 46 ug/dL (ref 28–170)
Saturation Ratios: 14 % (ref 10.4–31.8)
TIBC: 330 ug/dL (ref 250–450)
UIBC: 285 ug/dL

## 2024-11-16 ENCOUNTER — Inpatient Hospital Stay: Admitting: Oncology

## 2024-11-24 ENCOUNTER — Inpatient Hospital Stay: Admitting: Oncology

## 2024-11-24 VITALS — BP 125/73 | HR 83 | Temp 97.7°F | Resp 18

## 2024-11-24 DIAGNOSIS — D500 Iron deficiency anemia secondary to blood loss (chronic): Secondary | ICD-10-CM

## 2024-11-24 NOTE — Assessment & Plan Note (Addendum)
 The most likely cause of her anemia is due to chronic blood loss or malabsorption syndrome. Colonoscopy/EGD on 06/03/2024 showed 5 mm polyp in sigmoid colon, diverticulosis and nonbleeding internal hemorrhoid.  EGD showed normal esophagus with a 2 cm hiatal hernia and gastritis.  Pathology was negative for metaplasia or dysplasia. She received 3 doses of IV Venofer  last given on 08/06/2024. Repeat labs showed improvement of her hemoglobin after iron  infusion. OTC iron  tablets versus 1 additional dose of IV iron  based on patient tolerance. Patient would like to try oral iron  and we discussed taking it every other day with vitamin C tablets. She continues to deny any bright red blood per rectum, melena or hematochezia. We discussed rechecking labs in 8 weeks and seeing NP back in 16 weeks. If you are unable to tolerate oral iron  tablets please contact office sooner and we can arrange for additional IV iron . Ideally we would like your ferritin greater than 50 and iron  saturations closer to 30%.  Hemoglobin is stable.

## 2024-11-24 NOTE — Progress Notes (Signed)
 "  [facility] OFFICE PROGRESS NOTE  Brandi Greig DEL, [name]  ASSESSMENT & PLAN:    Assessment & Plan Iron  deficiency anemia due to chronic blood loss The most likely cause of her anemia is due to chronic blood loss or malabsorption syndrome. Colonoscopy/EGD on 06/03/2024 showed 5 mm polyp in sigmoid colon, diverticulosis and nonbleeding internal hemorrhoid.  EGD showed normal esophagus with a 2 cm hiatal hernia and gastritis.  Pathology was negative for metaplasia or dysplasia. She received 3 doses of IV Venofer  last given on 08/06/2024. Repeat labs showed improvement of her hemoglobin after iron  infusion. OTC iron  tablets versus 1 additional dose of IV iron  based on patient tolerance. Patient would like to try oral iron  and we discussed taking it every other day with vitamin C tablets. She continues to deny any bright red blood per rectum, melena or hematochezia. We discussed rechecking labs in 8 weeks and seeing NP back in 16 weeks. If you are unable to tolerate oral iron  tablets please contact office sooner and we can arrange for additional IV iron . Ideally we would like your ferritin greater than 50 and iron  saturations closer to 30%.  Hemoglobin is stable.  Orders Placed This Encounter  Procedures   CBC with Differential    Standing Status:   Future    Expected Date:   01/19/2025    Expiration Date:   04/19/2025   Ferritin    Standing Status:   Future    Expected Date:   01/19/2025    Expiration Date:   04/19/2025   Iron  and TIBC ([facility] ONLY)    Standing Status:   Future    Expected Date:   01/19/2025    Expiration Date:   04/19/2025   CBC with Differential    Standing Status:   Future    Expected Date:   03/24/2025    Expiration Date:   06/22/2025   Ferritin    Standing Status:   Future    Expected Date:   03/24/2025    Expiration Date:   06/22/2025   Iron  and TIBC ([facility] ONLY)    Standing Status:   Future    Expected Date:    03/24/2025    Expiration Date:   06/22/2025    INTERVAL HISTORY: Patient returns for follow-up.,  Patient had colonoscopy/EGD on 06/03/2024 which showed 1 5 mm polyp in sigmoid colon, diverticulosis and nonbleeding internal hemorrhoids.  EGD showed normal esophagus with a 2 cm hiatal hernia and gastritis.  Pathology was negative.  She received 3 doses of IV Venofer  on 05/18/2024, 05/25/2024 and 08/06/2024.  Reports developing severe fatigue following her IV iron  but otherwise tolerated just fine.  Denies any significant improvement of her symptoms following her iron  infusions.  Denies any obvious bleeding or bright red blood per rectum.  Appetite is 50% energy levels are 75%.  Denies any pain.  She has chronic constipation and occasional nausea.  She has dizziness and headaches at times.  Reports prior to now she was donating blood at least 3-4 times per year.  She has never had any issues with her iron  in the past.  Reports she developed a superficial blood clot in her left AC after her iron  infusion.  Reports she noticed some swelling and pain a few days after her infusion and came to clinic to have it looked at.  Of her left upper extremity showed a superficial thrombus involving the left cephalic vein and median cubital vein.  Symptoms eventually resolved on their own.  Reports she is no longer craving ice.  We reviewed iron  panel, ferritin, CBC and CMP.  SUMMARY OF HEMATOLOGIC HISTORY: Oncology History   No problem history exists.     CBC    Component Value Date/Time   WBC 6.8 11/09/2024 1537   RBC 4.56 11/09/2024 1537   HGB 14.5 11/09/2024 1537   HCT 42.2 11/09/2024 1537   PLT 209 11/09/2024 1537   MCV 92.5 11/09/2024 1537   MCH 31.8 11/09/2024 1537   MCHC 34.4 11/09/2024 1537   RDW 14.5 11/09/2024 1537   LYMPHSABS 1.5 11/09/2024 1537   MONOABS 0.5 11/09/2024 1537   EOSABS 0.5 11/09/2024 1537   BASOSABS 0.0 11/09/2024 1537       Latest Ref Rng & Units 05/14/2024   11:31 AM  04/26/2022   10:31 AM 08/17/2021    4:45 PM  CMP  Glucose 70 - 99 mg/dL 89  93  89   BUN 6 - 20 mg/dL 9  7  6    Creatinine 0.44 - 1.00 mg/dL 9.45  9.43  9.37   Sodium 135 - 145 mmol/L 139  141  142   Potassium 3.5 - 5.1 mmol/L 4.2  4.5  4.5   Chloride 98 - 111 mmol/L 104  104  104   CO2 22 - 32 mmol/L 22  26  24    Calcium  8.9 - 10.3 mg/dL 9.3  9.8  9.4   Total Protein 6.5 - 8.1 g/dL 7.3     Total Bilirubin 0.0 - 1.2 mg/dL <9.7     Alkaline Phos 38 - 126 U/L 98     AST 15 - 41 U/L 19     ALT 0 - 44 U/L 22        Lab Results  Component Value Date   FERRITIN 28 11/09/2024   VITAMINB12 526 05/14/2024    Vitals:   11/24/24 1414 11/24/24 1421  BP: (!) 120/99 125/73  Pulse: 83   Resp: 18   Temp: 97.7 F (36.5 C)   SpO2: 99%     Review of System:  Review of Systems  Constitutional:  Positive for malaise/fatigue.  Gastrointestinal:  Positive for constipation and nausea.  Neurological:  Positive for dizziness and headaches.    Physical Exam: Physical Exam Constitutional:      Appearance: Normal appearance.  HENT:     Head: Normocephalic and atraumatic.  Eyes:     Pupils: Pupils are equal, round, and reactive to light.  Cardiovascular:     Rate and Rhythm: Normal rate and regular rhythm.     Heart sounds: Normal heart sounds. No murmur heard. Pulmonary:     Effort: Pulmonary effort is normal.     Breath sounds: Normal breath sounds. No wheezing.  Abdominal:     General: Bowel sounds are normal. There is no distension.     Palpations: Abdomen is soft.     Tenderness: There is no abdominal tenderness.  Musculoskeletal:        General: Normal range of motion.     Cervical back: Normal range of motion.  Skin:    General: Skin is warm and dry.     Findings: No rash.  Neurological:     Mental Status: She is alert and oriented to person, place, and time.     Gait: Gait is intact.  Psychiatric:        Mood and Affect: Mood and affect normal.  Cognition and  Memory: Memory normal.        Judgment: Judgment normal.      I spent 25 minutes dedicated to the care of this patient (face-to-face and non-face-to-face) on the date of the encounter to include what is described in the assessment and plan.,  [name], NP 11/24/2024 2:55 PM "

## 2024-12-01 ENCOUNTER — Encounter (HOSPITAL_BASED_OUTPATIENT_CLINIC_OR_DEPARTMENT_OTHER): Payer: Self-pay | Admitting: Pulmonary Disease

## 2024-12-01 DIAGNOSIS — R5383 Other fatigue: Secondary | ICD-10-CM

## 2024-12-01 DIAGNOSIS — R0683 Snoring: Secondary | ICD-10-CM

## 2025-01-19 ENCOUNTER — Inpatient Hospital Stay

## 2025-03-23 ENCOUNTER — Inpatient Hospital Stay

## 2025-03-30 ENCOUNTER — Inpatient Hospital Stay: Admitting: Oncology
